# Patient Record
Sex: Female | Born: 1955 | Race: White | Hispanic: No | Marital: Single | Smoking: Never smoker
Health system: Southern US, Community
[De-identification: ages and names within clinical notes are randomized; demographics above are authoritative.]

## PROBLEM LIST (undated history)

## (undated) DIAGNOSIS — I4892 Unspecified atrial flutter: Secondary | ICD-10-CM

## (undated) DIAGNOSIS — R519 Headache, unspecified: Secondary | ICD-10-CM

## (undated) DIAGNOSIS — G629 Polyneuropathy, unspecified: Secondary | ICD-10-CM

## (undated) DIAGNOSIS — G4733 Obstructive sleep apnea (adult) (pediatric): Secondary | ICD-10-CM

## (undated) DIAGNOSIS — F32A Depression, unspecified: Secondary | ICD-10-CM

## (undated) DIAGNOSIS — I219 Acute myocardial infarction, unspecified: Secondary | ICD-10-CM

## (undated) DIAGNOSIS — I499 Cardiac arrhythmia, unspecified: Secondary | ICD-10-CM

## (undated) DIAGNOSIS — R0600 Dyspnea, unspecified: Secondary | ICD-10-CM

## (undated) DIAGNOSIS — M1990 Unspecified osteoarthritis, unspecified site: Secondary | ICD-10-CM

## (undated) DIAGNOSIS — Z87898 Personal history of other specified conditions: Secondary | ICD-10-CM

## (undated) DIAGNOSIS — K219 Gastro-esophageal reflux disease without esophagitis: Secondary | ICD-10-CM

## (undated) DIAGNOSIS — E119 Type 2 diabetes mellitus without complications: Secondary | ICD-10-CM

## (undated) DIAGNOSIS — G244 Idiopathic orofacial dystonia: Secondary | ICD-10-CM

## (undated) DIAGNOSIS — N900 Mild vulvar dysplasia: Secondary | ICD-10-CM

## (undated) DIAGNOSIS — R251 Tremor, unspecified: Secondary | ICD-10-CM

## (undated) DIAGNOSIS — R609 Edema, unspecified: Secondary | ICD-10-CM

## (undated) DIAGNOSIS — J449 Chronic obstructive pulmonary disease, unspecified: Secondary | ICD-10-CM

## (undated) DIAGNOSIS — G20 Parkinson's disease: Secondary | ICD-10-CM

## (undated) DIAGNOSIS — E669 Obesity, unspecified: Secondary | ICD-10-CM

## (undated) DIAGNOSIS — I2510 Atherosclerotic heart disease of native coronary artery without angina pectoris: Secondary | ICD-10-CM

## (undated) DIAGNOSIS — I10 Essential (primary) hypertension: Secondary | ICD-10-CM

## (undated) DIAGNOSIS — J45909 Unspecified asthma, uncomplicated: Secondary | ICD-10-CM

## (undated) DIAGNOSIS — F329 Major depressive disorder, single episode, unspecified: Secondary | ICD-10-CM

## (undated) DIAGNOSIS — I82409 Acute embolism and thrombosis of unspecified deep veins of unspecified lower extremity: Secondary | ICD-10-CM

## (undated) DIAGNOSIS — Z87442 Personal history of urinary calculi: Secondary | ICD-10-CM

## (undated) DIAGNOSIS — G20A1 Parkinson's disease without dyskinesia, without mention of fluctuations: Secondary | ICD-10-CM

## (undated) DIAGNOSIS — Z7409 Other reduced mobility: Secondary | ICD-10-CM

## (undated) DIAGNOSIS — R0902 Hypoxemia: Secondary | ICD-10-CM

## (undated) DIAGNOSIS — R51 Headache: Secondary | ICD-10-CM

## (undated) HISTORY — PX: TUBAL LIGATION: SHX77

## (undated) HISTORY — PX: EYE SURGERY: SHX253

## (undated) HISTORY — PX: LAPAROSCOPIC TUBAL LIGATION: SUR803

## (undated) HISTORY — PX: JOINT REPLACEMENT: SHX530

## (undated) HISTORY — DX: Mild vulvar dysplasia: N90.0

## (undated) HISTORY — PX: KIDNEY STONE SURGERY: SHX686

## (undated) HISTORY — PX: CHOLECYSTECTOMY: SHX55

## (undated) HISTORY — PX: TONSILLECTOMY AND ADENOIDECTOMY: SUR1326

---

## 2005-11-11 ENCOUNTER — Ambulatory Visit: Payer: Self-pay | Admitting: Family Medicine

## 2005-11-13 ENCOUNTER — Ambulatory Visit: Payer: Self-pay | Admitting: Family Medicine

## 2005-11-29 ENCOUNTER — Ambulatory Visit: Payer: Self-pay | Admitting: Family Medicine

## 2005-12-30 ENCOUNTER — Ambulatory Visit: Payer: Self-pay | Admitting: Family Medicine

## 2006-01-30 ENCOUNTER — Ambulatory Visit: Payer: Self-pay | Admitting: Family Medicine

## 2007-09-01 ENCOUNTER — Emergency Department: Payer: Self-pay | Admitting: Emergency Medicine

## 2007-10-27 ENCOUNTER — Ambulatory Visit: Payer: Self-pay | Admitting: Gastroenterology

## 2007-10-29 ENCOUNTER — Ambulatory Visit: Payer: Self-pay | Admitting: Family Medicine

## 2008-01-15 ENCOUNTER — Encounter: Payer: Self-pay | Admitting: Family Medicine

## 2008-01-31 ENCOUNTER — Encounter: Payer: Self-pay | Admitting: Family Medicine

## 2008-02-28 ENCOUNTER — Encounter: Payer: Self-pay | Admitting: Family Medicine

## 2008-03-14 ENCOUNTER — Ambulatory Visit: Payer: Self-pay | Admitting: Family Medicine

## 2008-03-30 ENCOUNTER — Ambulatory Visit: Payer: Self-pay | Admitting: Family Medicine

## 2008-04-29 ENCOUNTER — Ambulatory Visit: Payer: Self-pay | Admitting: Family Medicine

## 2008-09-12 ENCOUNTER — Ambulatory Visit: Payer: Self-pay | Admitting: Unknown Physician Specialty

## 2008-09-23 ENCOUNTER — Inpatient Hospital Stay: Payer: Self-pay | Admitting: Unknown Physician Specialty

## 2008-12-19 ENCOUNTER — Ambulatory Visit: Payer: Self-pay | Admitting: Family Medicine

## 2009-04-25 ENCOUNTER — Ambulatory Visit: Payer: Self-pay | Admitting: Family Medicine

## 2010-05-10 ENCOUNTER — Ambulatory Visit: Payer: Self-pay | Admitting: Neurology

## 2010-06-28 ENCOUNTER — Ambulatory Visit: Payer: Self-pay | Admitting: Family Medicine

## 2011-11-25 ENCOUNTER — Ambulatory Visit: Payer: Self-pay | Admitting: Family Medicine

## 2012-01-09 ENCOUNTER — Ambulatory Visit: Payer: Self-pay | Admitting: Primary Care

## 2012-07-20 ENCOUNTER — Ambulatory Visit: Payer: Self-pay | Admitting: Family Medicine

## 2012-07-23 ENCOUNTER — Emergency Department: Payer: Self-pay | Admitting: Emergency Medicine

## 2012-07-23 LAB — COMPREHENSIVE METABOLIC PANEL
Alkaline Phosphatase: 237 U/L — ABNORMAL HIGH (ref 50–136)
Bilirubin,Total: 3.4 mg/dL — ABNORMAL HIGH (ref 0.2–1.0)
Calcium, Total: 8.4 mg/dL — ABNORMAL LOW (ref 8.5–10.1)
Chloride: 100 mmol/L (ref 98–107)
Co2: 26 mmol/L (ref 21–32)
EGFR (African American): 51 — ABNORMAL LOW
EGFR (Non-African Amer.): 44 — ABNORMAL LOW
Glucose: 480 mg/dL — ABNORMAL HIGH (ref 65–99)
Osmolality: 292 (ref 275–301)
SGPT (ALT): 152 U/L — ABNORMAL HIGH
Sodium: 135 mmol/L — ABNORMAL LOW (ref 136–145)

## 2012-07-23 LAB — URINALYSIS, COMPLETE
Glucose,UR: >=500 mg/dL (ref 0–75)
Nitrite: NEGATIVE
Protein: NEGATIVE
RBC,UR: NONE SEEN /HPF (ref 0–5)
Specific Gravity: 1.027 (ref 1.003–1.030)
Squamous Epithelial: 1
WBC UR: NONE SEEN /HPF (ref 0–5)

## 2012-07-23 LAB — CBC
HCT: 33.4 % — ABNORMAL LOW (ref 35.0–47.0)
HGB: 10.6 g/dL — ABNORMAL LOW (ref 12.0–16.0)
MCH: 27.7 pg (ref 26.0–34.0)
MCHC: 31.7 g/dL — ABNORMAL LOW (ref 32.0–36.0)
MCV: 87 fL (ref 80–100)
Platelet: 260 10*3/uL (ref 150–440)
RDW: 18.4 % — ABNORMAL HIGH (ref 11.5–14.5)

## 2012-07-23 LAB — LIPASE, BLOOD: Lipase: 1847 U/L — ABNORMAL HIGH (ref 73–393)

## 2012-12-01 IMAGING — CR DG CHEST 2V
1 series · 2 of 2 positions shown · non-contrast
Comparison: none

REASON FOR EXAM: Pneumonia
COMMENTS:

[Series 1: w chest ap · 0.14mm/px · 2 of 2 slices shown]
[im 1/2]
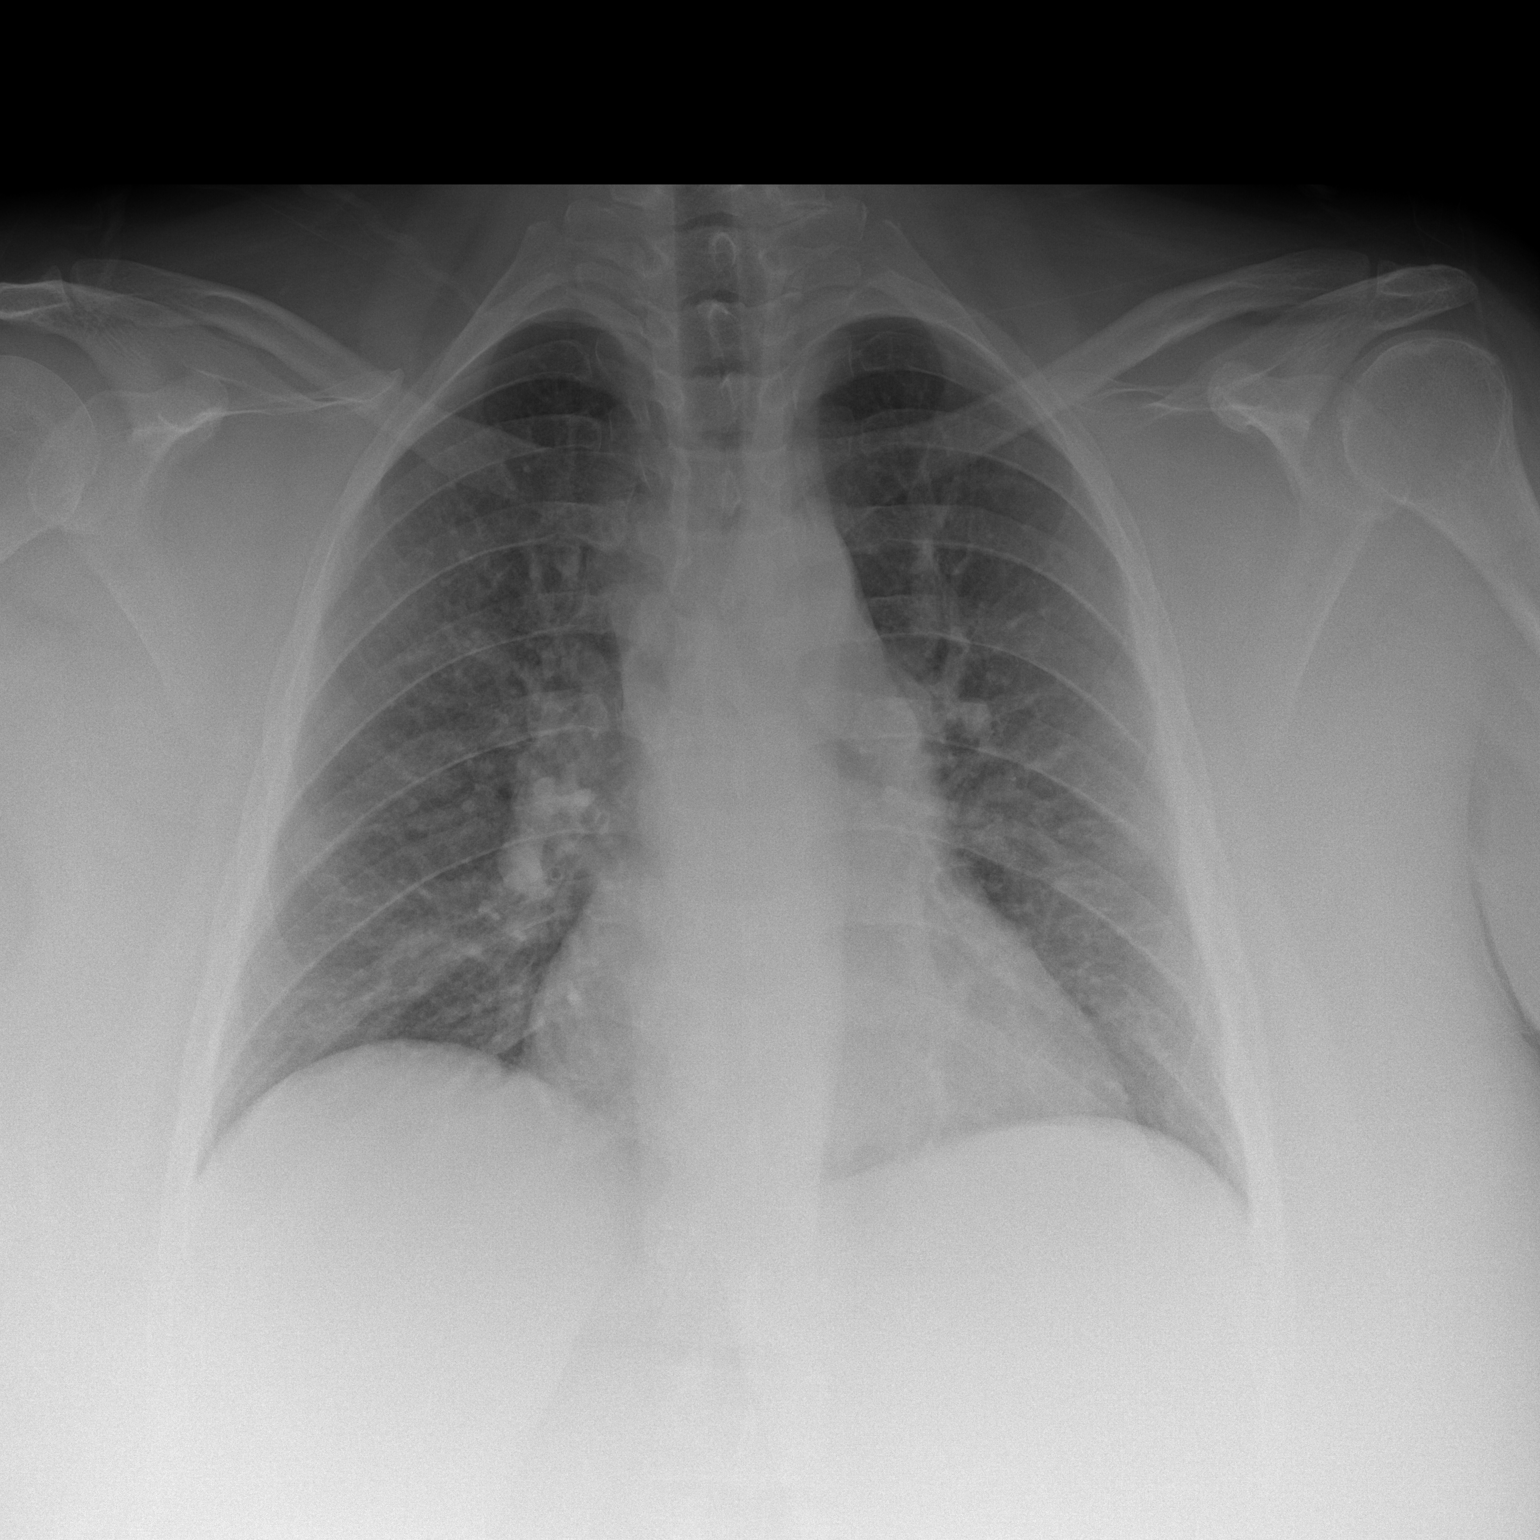
[im 2/2]
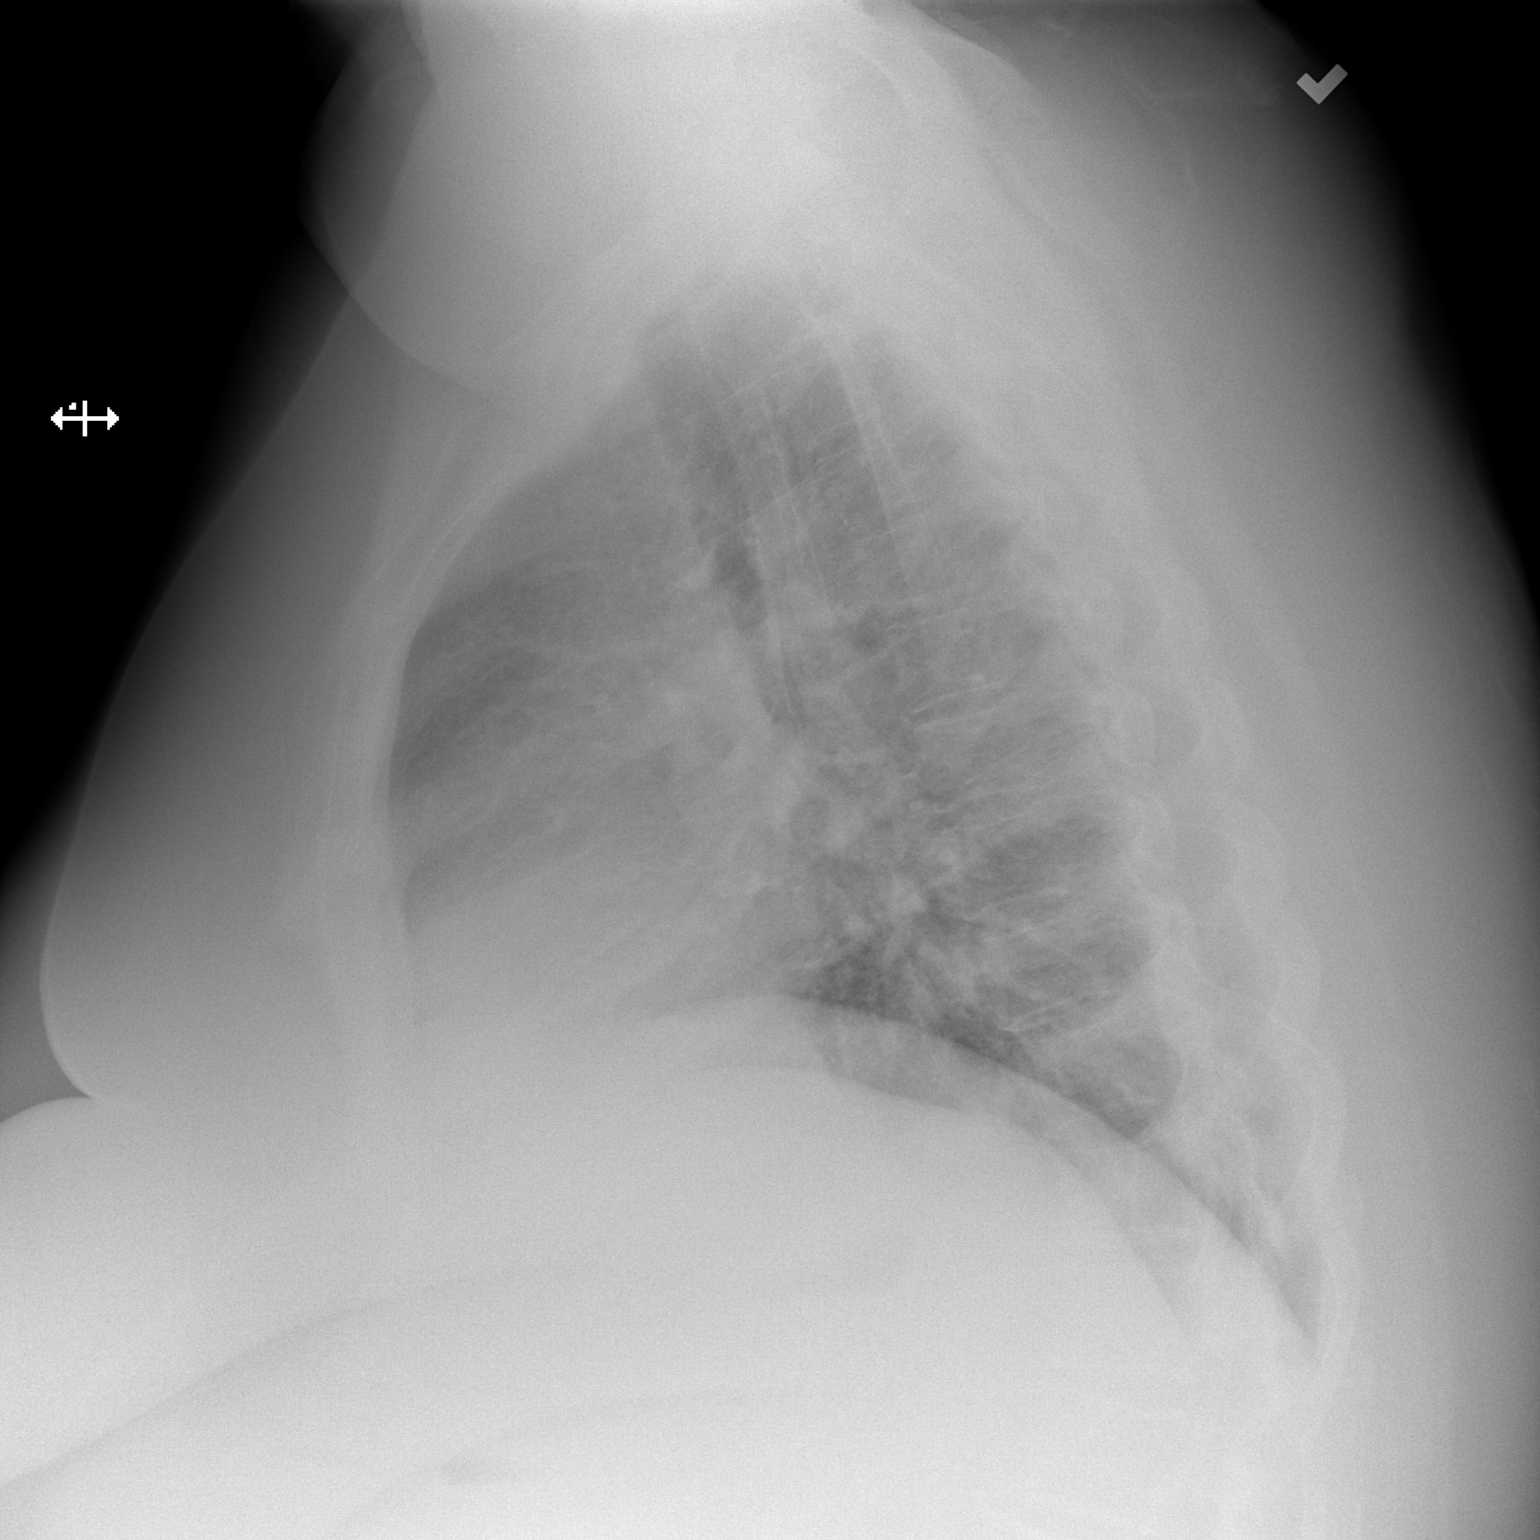

[2 of 2 positions shown; findings below may reference images not displayed]

PROCEDURE:     DXR - DXR CHEST PA (OR AP) AND LATERAL  - July 20, 2012  [DATE]

RESULT:     Comparison is made to a prior study dated 01/09/2012.

There has been interval development of a rounded area of increased density
within the left lower lobe. Differential considerations are an infiltrate
versus atelectasis. A nodule cannot be excluded. Clinical correlation
recommended and surveillance evaluation status post appropriate therapeutic
management. The cardiac silhouette and visualized bony skeleton is
unremarkable.
IMPRESSION: Right lower lobe infiltrate and/or atelectasis as described above.
Surveillance evaluation recommended. More ominous etiologies cannot be
completely excluded.

Thank you for the opportunity to contribute to the care of your patient.

ADDENDUM:  07/22/2012: Correction to report dated 07/20/2012

Comparison is made to a prior study dated 01/09/2012.

There has been interval development of a rounded area of increased density
within the RIGHT lower lobe. Differential considerations are an infiltrate
versus atelectasis.  A nodule cannot be excluded. Clinical correlation
recommended and surveillance evaluation status post appropriate therapeutic
management. The cardiac silhouette and visualized bony skeleton is
unremarkable.
CONCLUSION: Right lower lobe infiltrate and/or atelectasis as described above.
Surveillance evaluation recommended.  More ominous etiologies cannot be
completely excluded.

## 2013-02-24 ENCOUNTER — Ambulatory Visit: Payer: Self-pay | Admitting: Family Medicine

## 2014-03-10 ENCOUNTER — Ambulatory Visit: Payer: Self-pay | Admitting: Family Medicine

## 2014-03-16 ENCOUNTER — Ambulatory Visit: Payer: Self-pay | Admitting: Family Medicine

## 2014-12-29 ENCOUNTER — Emergency Department: Payer: Self-pay | Admitting: Internal Medicine

## 2014-12-29 LAB — URINALYSIS, COMPLETE
BLOOD: NEGATIVE
Bilirubin,UR: NEGATIVE
Glucose,UR: >=500 mg/dL (ref 0–75)
Hyaline Cast: 2
Ketone: NEGATIVE
Leukocyte Esterase: NEGATIVE
Nitrite: NEGATIVE
PH: 5 (ref 4.5–8.0)
RBC,UR: <1 /HPF (ref 0–5)
SPECIFIC GRAVITY: 1.017 (ref 1.003–1.030)
Squamous Epithelial: 1
WBC UR: 1 /HPF (ref 0–5)

## 2014-12-29 LAB — COMPREHENSIVE METABOLIC PANEL
AST: 74 U/L — AB (ref 15–37)
Albumin: 2.8 g/dL — ABNORMAL LOW (ref 3.4–5.0)
Alkaline Phosphatase: 95 U/L
Anion Gap: 6 — ABNORMAL LOW (ref 7–16)
BILIRUBIN TOTAL: 0.7 mg/dL (ref 0.2–1.0)
BUN: 13 mg/dL (ref 7–18)
CO2: 30 mmol/L (ref 21–32)
CREATININE: 0.96 mg/dL (ref 0.60–1.30)
Calcium, Total: 8.8 mg/dL (ref 8.5–10.1)
Chloride: 101 mmol/L (ref 98–107)
EGFR (African American): >60
Glucose: 289 mg/dL — ABNORMAL HIGH (ref 65–99)
Osmolality: 285 (ref 275–301)
Potassium: 4.6 mmol/L (ref 3.5–5.1)
SGPT (ALT): 37 U/L
Sodium: 137 mmol/L (ref 136–145)
TOTAL PROTEIN: 7.6 g/dL (ref 6.4–8.2)

## 2014-12-29 LAB — CBC
HCT: 38.9 % (ref 35.0–47.0)
HGB: 12.1 g/dL (ref 12.0–16.0)
MCH: 26.6 pg (ref 26.0–34.0)
MCHC: 31 g/dL — ABNORMAL LOW (ref 32.0–36.0)
MCV: 86 fL (ref 80–100)
Platelet: 188 10*3/uL (ref 150–440)
RBC: 4.54 10*6/uL (ref 3.80–5.20)
RDW: 15.8 % — ABNORMAL HIGH (ref 11.5–14.5)
WBC: 5.5 10*3/uL (ref 3.6–11.0)

## 2015-02-04 ENCOUNTER — Emergency Department: Payer: Self-pay | Admitting: Emergency Medicine

## 2015-02-04 ENCOUNTER — Inpatient Hospital Stay: Admission: AD | Admit: 2015-02-04 | Payer: Self-pay | Source: Other Acute Inpatient Hospital | Admitting: Cardiology

## 2015-02-04 LAB — CBC
HCT: 44.8 % (ref 35.0–47.0)
HGB: 14.3 g/dL (ref 12.0–16.0)
MCH: 26.9 pg (ref 26.0–34.0)
MCHC: 31.9 g/dL — ABNORMAL LOW (ref 32.0–36.0)
MCV: 84 fL (ref 80–100)
PLATELETS: 221 10*3/uL (ref 150–440)
RBC: 5.32 10*6/uL — AB (ref 3.80–5.20)
RDW: 15.8 % — ABNORMAL HIGH (ref 11.5–14.5)
WBC: 8.5 10*3/uL (ref 3.6–11.0)

## 2015-02-04 LAB — TROPONIN I

## 2015-02-04 LAB — URINALYSIS, COMPLETE
Bilirubin,UR: NEGATIVE
Broad Cast: 20
Glucose,UR: >=500 mg/dL (ref 0–75)
Hyaline Cast: 61
KETONE: NEGATIVE
Nitrite: NEGATIVE
PH: 5 (ref 4.5–8.0)
Protein: 100
RBC,UR: 26 /HPF (ref 0–5)
SPECIFIC GRAVITY: 1.02 (ref 1.003–1.030)
Squamous Epithelial: NONE SEEN

## 2015-02-04 LAB — PROTIME-INR
INR: 1.1
Prothrombin Time: 13.9 secs

## 2015-02-04 LAB — BASIC METABOLIC PANEL
Anion Gap: 9 (ref 7–16)
BUN: 17 mg/dL (ref 7–18)
CHLORIDE: 94 mmol/L — AB (ref 98–107)
CREATININE: 1.69 mg/dL — AB (ref 0.60–1.30)
Calcium, Total: 9.4 mg/dL (ref 8.5–10.1)
Co2: 27 mmol/L (ref 21–32)
EGFR (African American): 40 — ABNORMAL LOW
EGFR (Non-African Amer.): 33 — ABNORMAL LOW
Glucose: 341 mg/dL — ABNORMAL HIGH (ref 65–99)
Osmolality: 276 (ref 275–301)
POTASSIUM: 4.4 mmol/L (ref 3.5–5.1)
Sodium: 130 mmol/L — ABNORMAL LOW (ref 136–145)

## 2015-02-04 LAB — PRO B NATRIURETIC PEPTIDE: B-TYPE NATIURETIC PEPTID: 157 pg/mL — AB (ref 0–125)

## 2015-04-26 ENCOUNTER — Ambulatory Visit
Admit: 2015-04-26 | Disposition: A | Discharge status: Home / Self Care | Payer: Self-pay | Attending: Family Medicine | Admitting: Family Medicine

## 2015-07-08 ENCOUNTER — Encounter: Payer: Self-pay | Admitting: Internal Medicine

## 2015-07-08 ENCOUNTER — Emergency Department: Payer: Medicare Other

## 2015-07-08 ENCOUNTER — Observation Stay
Admission: EM | Admit: 2015-07-08 | Discharge: 2015-07-09 | Disposition: A | Discharge status: Home / Self Care | Payer: Medicare Other | Attending: Internal Medicine | Admitting: Internal Medicine

## 2015-07-08 DIAGNOSIS — Z882 Allergy status to sulfonamides status: Secondary | ICD-10-CM | POA: Insufficient documentation

## 2015-07-08 DIAGNOSIS — Z888 Allergy status to other drugs, medicaments and biological substances status: Secondary | ICD-10-CM | POA: Insufficient documentation

## 2015-07-08 DIAGNOSIS — R609 Edema, unspecified: Secondary | ICD-10-CM

## 2015-07-08 DIAGNOSIS — I25119 Atherosclerotic heart disease of native coronary artery with unspecified angina pectoris: Principal | ICD-10-CM | POA: Insufficient documentation

## 2015-07-08 DIAGNOSIS — Z794 Long term (current) use of insulin: Secondary | ICD-10-CM | POA: Insufficient documentation

## 2015-07-08 DIAGNOSIS — I252 Old myocardial infarction: Secondary | ICD-10-CM | POA: Insufficient documentation

## 2015-07-08 DIAGNOSIS — Z7982 Long term (current) use of aspirin: Secondary | ICD-10-CM | POA: Diagnosis not present

## 2015-07-08 DIAGNOSIS — Z825 Family history of asthma and other chronic lower respiratory diseases: Secondary | ICD-10-CM | POA: Insufficient documentation

## 2015-07-08 DIAGNOSIS — Z8249 Family history of ischemic heart disease and other diseases of the circulatory system: Secondary | ICD-10-CM | POA: Diagnosis not present

## 2015-07-08 DIAGNOSIS — I208 Other forms of angina pectoris: Secondary | ICD-10-CM | POA: Diagnosis present

## 2015-07-08 DIAGNOSIS — E785 Hyperlipidemia, unspecified: Secondary | ICD-10-CM | POA: Diagnosis not present

## 2015-07-08 DIAGNOSIS — Z9889 Other specified postprocedural states: Secondary | ICD-10-CM | POA: Insufficient documentation

## 2015-07-08 DIAGNOSIS — Z833 Family history of diabetes mellitus: Secondary | ICD-10-CM | POA: Insufficient documentation

## 2015-07-08 DIAGNOSIS — I10 Essential (primary) hypertension: Secondary | ICD-10-CM | POA: Insufficient documentation

## 2015-07-08 DIAGNOSIS — N289 Disorder of kidney and ureter, unspecified: Secondary | ICD-10-CM | POA: Diagnosis not present

## 2015-07-08 DIAGNOSIS — I2510 Atherosclerotic heart disease of native coronary artery without angina pectoris: Secondary | ICD-10-CM | POA: Diagnosis present

## 2015-07-08 DIAGNOSIS — R079 Chest pain, unspecified: Secondary | ICD-10-CM

## 2015-07-08 DIAGNOSIS — J449 Chronic obstructive pulmonary disease, unspecified: Secondary | ICD-10-CM | POA: Diagnosis not present

## 2015-07-08 DIAGNOSIS — E1165 Type 2 diabetes mellitus with hyperglycemia: Secondary | ICD-10-CM | POA: Diagnosis not present

## 2015-07-08 DIAGNOSIS — F32A Depression, unspecified: Secondary | ICD-10-CM | POA: Diagnosis present

## 2015-07-08 DIAGNOSIS — I4892 Unspecified atrial flutter: Secondary | ICD-10-CM | POA: Diagnosis not present

## 2015-07-08 DIAGNOSIS — R251 Tremor, unspecified: Secondary | ICD-10-CM | POA: Diagnosis not present

## 2015-07-08 DIAGNOSIS — Z9049 Acquired absence of other specified parts of digestive tract: Secondary | ICD-10-CM | POA: Diagnosis not present

## 2015-07-08 DIAGNOSIS — Z9660 Presence of unspecified orthopedic joint implant: Secondary | ICD-10-CM | POA: Insufficient documentation

## 2015-07-08 DIAGNOSIS — R0602 Shortness of breath: Secondary | ICD-10-CM | POA: Insufficient documentation

## 2015-07-08 DIAGNOSIS — Z883 Allergy status to other anti-infective agents status: Secondary | ICD-10-CM | POA: Diagnosis not present

## 2015-07-08 DIAGNOSIS — F329 Major depressive disorder, single episode, unspecified: Secondary | ICD-10-CM | POA: Diagnosis not present

## 2015-07-08 DIAGNOSIS — K219 Gastro-esophageal reflux disease without esophagitis: Secondary | ICD-10-CM | POA: Insufficient documentation

## 2015-07-08 DIAGNOSIS — IMO0002 Reserved for concepts with insufficient information to code with codable children: Secondary | ICD-10-CM | POA: Diagnosis present

## 2015-07-08 DIAGNOSIS — J45909 Unspecified asthma, uncomplicated: Secondary | ICD-10-CM | POA: Diagnosis not present

## 2015-07-08 HISTORY — DX: Major depressive disorder, single episode, unspecified: F32.9

## 2015-07-08 HISTORY — DX: Depression, unspecified: F32.A

## 2015-07-08 HISTORY — DX: Type 2 diabetes mellitus without complications: E11.9

## 2015-07-08 HISTORY — DX: Unspecified atrial flutter: I48.92

## 2015-07-08 HISTORY — DX: Unspecified asthma, uncomplicated: J45.909

## 2015-07-08 HISTORY — DX: Tremor, unspecified: R25.1

## 2015-07-08 HISTORY — DX: Atherosclerotic heart disease of native coronary artery without angina pectoris: I25.10

## 2015-07-08 HISTORY — DX: Gastro-esophageal reflux disease without esophagitis: K21.9

## 2015-07-08 HISTORY — DX: Essential (primary) hypertension: I10

## 2015-07-08 LAB — GLUCOSE, CAPILLARY: Glucose-Capillary: 348 mg/dL — ABNORMAL HIGH (ref 65–99)

## 2015-07-08 LAB — BASIC METABOLIC PANEL
ANION GAP: 11 (ref 5–15)
BUN: 25 mg/dL — ABNORMAL HIGH (ref 6–20)
CALCIUM: 8.2 mg/dL — AB (ref 8.9–10.3)
CO2: 27 mmol/L (ref 22–32)
Chloride: 98 mmol/L — ABNORMAL LOW (ref 101–111)
Creatinine, Ser: 1.61 mg/dL — ABNORMAL HIGH (ref 0.44–1.00)
GFR calc Af Amer: 40 mL/min — ABNORMAL LOW (ref >60)
GFR calc non Af Amer: 34 mL/min — ABNORMAL LOW (ref >60)
Glucose, Bld: 339 mg/dL — ABNORMAL HIGH (ref 65–99)
Potassium: 4.2 mmol/L (ref 3.5–5.1)
SODIUM: 136 mmol/L (ref 135–145)

## 2015-07-08 LAB — HEPATIC FUNCTION PANEL
ALBUMIN: 3.4 g/dL — AB (ref 3.5–5.0)
ALK PHOS: 82 U/L (ref 38–126)
ALT: 23 U/L (ref 14–54)
AST: 41 U/L (ref 15–41)
BILIRUBIN DIRECT: 0.2 mg/dL (ref 0.1–0.5)
BILIRUBIN INDIRECT: 0.7 mg/dL (ref 0.3–0.9)
BILIRUBIN TOTAL: 0.9 mg/dL (ref 0.3–1.2)
Total Protein: 7.9 g/dL (ref 6.5–8.1)

## 2015-07-08 LAB — FIBRIN DERIVATIVES D-DIMER (ARMC ONLY): Fibrin derivatives D-dimer (ARMC): 1006 — ABNORMAL HIGH (ref 0–499)

## 2015-07-08 LAB — CBC
HEMATOCRIT: 39 % (ref 35.0–47.0)
Hemoglobin: 12.6 g/dL (ref 12.0–16.0)
MCH: 27.3 pg (ref 26.0–34.0)
MCHC: 32.2 g/dL (ref 32.0–36.0)
MCV: 84.9 fL (ref 80.0–100.0)
Platelets: 185 10*3/uL (ref 150–440)
RBC: 4.59 MIL/uL (ref 3.80–5.20)
RDW: 16.7 % — ABNORMAL HIGH (ref 11.5–14.5)
WBC: 6.5 10*3/uL (ref 3.6–11.0)

## 2015-07-08 LAB — TROPONIN I

## 2015-07-08 MED ORDER — ALBUTEROL SULFATE (2.5 MG/3ML) 0.083% IN NEBU
3.0000 mL | INHALATION_SOLUTION | RESPIRATORY_TRACT | Status: DC | PRN
Start: 2015-07-08 — End: 2015-07-09

## 2015-07-08 MED ORDER — GABAPENTIN 300 MG PO CAPS
300.0000 mg | ORAL_CAPSULE | Freq: Two times a day (BID) | ORAL | Status: DC
  Administered 2015-07-08 – 2015-07-09 (×2): 300 mg via ORAL
  Filled 2015-07-08 (×2): qty 1

## 2015-07-08 MED ORDER — ACETAMINOPHEN 650 MG RE SUPP: 650.0000 mg | Freq: Four times a day (QID) | RECTAL | Status: DC | PRN

## 2015-07-08 MED ORDER — ACETAMINOPHEN 325 MG PO TABS: 650.0000 mg | ORAL_TABLET | Freq: Four times a day (QID) | ORAL | Status: DC | PRN

## 2015-07-08 MED ORDER — INSULIN ASPART 100 UNIT/ML ~~LOC~~ SOLN
0.0000 [IU] | Freq: Every day | SUBCUTANEOUS | Status: DC
  Administered 2015-07-08: 4 [IU] via SUBCUTANEOUS
  Filled 2015-07-08: qty 4

## 2015-07-08 MED ORDER — HEPARIN SODIUM (PORCINE) 5000 UNIT/ML IJ SOLN
5000.0000 [IU] | Freq: Three times a day (TID) | INTRAMUSCULAR | Status: DC
Start: 2015-07-08 — End: 2015-07-09
  Administered 2015-07-08 – 2015-07-09 (×2): 5000 [IU] via SUBCUTANEOUS
  Filled 2015-07-08 (×2): qty 1

## 2015-07-08 MED ORDER — ASPIRIN EC 81 MG PO TBEC
81.0000 mg | DELAYED_RELEASE_TABLET | Freq: Every day | ORAL | Status: DC
  Administered 2015-07-09: 81 mg via ORAL
  Filled 2015-07-08: qty 1

## 2015-07-08 MED ORDER — INSULIN GLARGINE 100 UNIT/ML ~~LOC~~ SOLN
30.0000 [IU] | Freq: Two times a day (BID) | SUBCUTANEOUS | Status: DC
  Administered 2015-07-08 – 2015-07-09 (×2): 30 [IU] via SUBCUTANEOUS
  Filled 2015-07-08 (×4): qty 0.3

## 2015-07-08 MED ORDER — INSULIN ASPART 100 UNIT/ML ~~LOC~~ SOLN
0.0000 [IU] | Freq: Three times a day (TID) | SUBCUTANEOUS | Status: DC
  Administered 2015-07-09: 11 [IU] via SUBCUTANEOUS
  Administered 2015-07-09: 8 [IU] via SUBCUTANEOUS
  Filled 2015-07-08: qty 11
  Filled 2015-07-08: qty 8

## 2015-07-08 MED ORDER — PAROXETINE HCL 10 MG PO TABS
40.0000 mg | ORAL_TABLET | ORAL | Status: DC
  Administered 2015-07-09: 40 mg via ORAL
  Filled 2015-07-08: qty 4

## 2015-07-08 MED ORDER — ROSUVASTATIN CALCIUM 10 MG PO TABS
10.0000 mg | ORAL_TABLET | Freq: Every day | ORAL | Status: DC
  Administered 2015-07-09: 10 mg via ORAL
  Filled 2015-07-08: qty 1

## 2015-07-08 MED ORDER — PANTOPRAZOLE SODIUM 40 MG PO TBEC
40.0000 mg | DELAYED_RELEASE_TABLET | Freq: Every day | ORAL | Status: DC
  Administered 2015-07-09: 40 mg via ORAL
  Filled 2015-07-08: qty 1

## 2015-07-08 MED ORDER — LISINOPRIL 10 MG PO TABS
10.0000 mg | ORAL_TABLET | Freq: Every day | ORAL | Status: DC
  Administered 2015-07-09: 10 mg via ORAL
  Filled 2015-07-08: qty 1

## 2015-07-08 MED ORDER — BUDESONIDE-FORMOTEROL FUMARATE 160-4.5 MCG/ACT IN AERO
2.0000 | INHALATION_SPRAY | Freq: Two times a day (BID) | RESPIRATORY_TRACT | Status: DC
  Administered 2015-07-08 – 2015-07-09 (×2): 2 via RESPIRATORY_TRACT
  Filled 2015-07-08: qty 6

## 2015-07-08 MED ORDER — METOPROLOL TARTRATE 25 MG PO TABS
12.5000 mg | ORAL_TABLET | Freq: Two times a day (BID) | ORAL | Status: DC
  Administered 2015-07-08 – 2015-07-09 (×2): 12.5 mg via ORAL
  Filled 2015-07-08 (×2): qty 1

## 2015-07-08 MED ORDER — HYDROCODONE-ACETAMINOPHEN 10-325 MG PO TABS: 1.0000 | ORAL_TABLET | Freq: Three times a day (TID) | ORAL | Status: DC | PRN

## 2015-07-08 NOTE — H&P (Signed)
[facility] [place] at [facility]   PATIENT NAME: Audrey Hughes    MR#:  [id number]  DATE OF BIRTH:  17-Feb-1956  DATE OF ADMISSION:  07/08/2015  PRIMARY CARE PHYSICIAN: Hillery Aldo, MD   REQUESTING/REFERRING PHYSICIAN: Malinda  CHIEF COMPLAINT:   Chief Complaint  Patient presents with  . Chest Pain    HISTORY OF PRESENT ILLNESS:  Audrey Hughes  is a 59 y.o. female who presents with an episode of angina at rest. Patient had an MI back in February 2016. She states that today she was resting when she had acute onset of diffuse chest discomfort. She is unable to characterize her chest pain any more than that. She states that it did move up into her throat. It was associated with some shortness of breath, but without any nausea or vomiting, diaphoresis, dizziness, or vision changes. She called EMS and was told to take 4 baby aspirin, which she did. Shortly after that the pain started to subside and eventually resolved completely. In the ED her workup is largely negative. Troponin negative 1, EKG unchanged from prior, other labs largely within normal limits. She did have a d-dimer checked in the ED which was elevated, but her well's score for PE is 0, and so was decided not to scan her for PE. Hospitalists were called for admission for ACS rule out. Of note, patient does have a history of paroxysmal atrial flutter, and did state that she had some palpitations around the time of her chest pain.  PAST MEDICAL HISTORY:   Past Medical History  Diagnosis Date  . Diabetes mellitus   . Asthma   . CAD (coronary artery disease)     s/p MI  . Depression   . HTN (hypertension)   . Tremor   . Paroxysmal atrial flutter   . GERD (gastroesophageal reflux disease)     PAST SURGICAL HISTORY:   Past Surgical History  Procedure Laterality Date  . Cholecystectomy    . Cesarean section    . Abdominal hysterectomy    . Tonsillectomy and adenoidectomy    . Laparoscopic tubal  ligation    . Joint replacement      SOCIAL HISTORY:   History  Substance Use Topics  . Smoking status: Never Smoker   . Smokeless tobacco: Not on file  . Alcohol Use: 0.0 oz/week    0 Standard drinks or equivalent per week     Comment: 1 can of beer    FAMILY HISTORY:   Family History  Problem Relation Age of Onset  . Diabetes Mother   . Heart disease Mother   . COPD Father   . Hypertension Father     DRUG ALLERGIES:   Allergies  Allergen Reactions  . Diltiazem Hives  . Flagyl [Metronidazole] Hives  . Sulfa Antibiotics Hives  . Toradol [Ketorolac Tromethamine] Hives    MEDICATIONS AT HOME:   Prior to Admission medications   Medication Sig Start Date End Date Taking? Authorizing Provider  albuterol (PROVENTIL HFA;VENTOLIN HFA) 108 (90 BASE) MCG/ACT inhaler Inhale 2 puffs into the lungs every 4 (four) hours as needed for wheezing or shortness of breath.   Yes Historical Provider, MD  aspirin 81 MG tablet Take 81 mg by mouth daily.   Yes Historical Provider, MD  budesonide-formoterol (SYMBICORT) 160-4.5 MCG/ACT inhaler Inhale 2 puffs into the lungs 2 (two) times daily.   Yes Historical Provider, MD  gabapentin (NEURONTIN) 300 MG capsule Take 300 mg by mouth 2 (  two) times daily.   Yes Historical Provider, MD  HYDROcodone-acetaminophen (NORCO) 10-325 MG per tablet Take 1 tablet by mouth 3 (three) times daily as needed for severe pain.   Yes Historical Provider, MD  indapamide (LOZOL) 2.5 MG tablet Take 2.5 mg by mouth 2 (two) times daily.   Yes Historical Provider, MD  insulin glargine (LANTUS) 100 UNIT/ML injection Inject 48 Units into the skin 2 (two) times daily.   Yes Historical Provider, MD  insulin lispro (HUMALOG) 100 UNIT/ML injection Inject 35 Units into the skin daily with breakfast.    Yes Historical Provider, MD  insulin lispro (HUMALOG) 100 UNIT/ML injection Inject 45 Units into the skin daily with supper.   Yes Historical Provider, MD  metFORMIN (GLUCOPHAGE)  500 MG tablet Take by mouth 2 (two) times daily with a meal.   Yes Historical Provider, MD  metoprolol tartrate (LOPRESSOR) 25 MG tablet Take 12.5 mg by mouth 2 (two) times daily.   Yes Historical Provider, MD  pantoprazole (PROTONIX) 40 MG tablet Take 40 mg by mouth daily.   Yes Historical Provider, MD  PARoxetine (PAXIL) 40 MG tablet Take 40 mg by mouth every morning.   Yes Historical Provider, MD  quinapril (ACCUPRIL) 10 MG tablet Take 10 mg by mouth 2 (two) times daily.   Yes Historical Provider, MD  rosuvastatin (CRESTOR) 10 MG tablet Take 10 mg by mouth daily.   Yes Historical Provider, MD    REVIEW OF SYSTEMS:  Review of Systems  Constitutional: Negative for fever, chills, weight loss and malaise/fatigue.  HENT: Negative for ear pain, hearing loss and tinnitus.   Eyes: Negative for blurred vision, double vision, pain and redness.  Respiratory: Positive for shortness of breath. Negative for cough and hemoptysis.   Cardiovascular: Positive for chest pain, palpitations and leg swelling (chronic). Negative for orthopnea.  Gastrointestinal: Negative for nausea, vomiting, abdominal pain, diarrhea and constipation.  Genitourinary: Negative for dysuria, frequency and hematuria.  Musculoskeletal: Negative for back pain, joint pain and neck pain.  Skin:       No acne, rash, or lesions  Neurological: Negative for dizziness, tremors, focal weakness and weakness.  Endo/Heme/Allergies: Negative for polydipsia. Does not bruise/bleed easily.  Psychiatric/Behavioral: Negative for depression. The patient is not nervous/anxious and does not have insomnia.      VITAL SIGNS:   Filed Vitals:   07/08/15 1923 07/08/15 2053  BP: 120/69 111/73  Pulse: 104 100  Resp: 20 18  Height: 6' (1.829 m)   Weight: 181.439 kg (400 lb)   SpO2: 97% 100%   Wt Readings from Last 3 Encounters:  07/08/15 181.439 kg (400 lb)    PHYSICAL EXAMINATION:  Physical Exam  Constitutional: She is oriented to person,  place, and time. She appears well-developed and well-nourished. No distress.  HENT:  Head: Normocephalic and atraumatic.  Mouth/Throat: Oropharynx is clear and moist.  Eyes: Conjunctivae and EOM are normal. Pupils are equal, round, and reactive to light. No scleral icterus.  Neck: Normal range of motion. Neck supple. No JVD present. No thyromegaly present.  Cardiovascular: Normal rate, regular rhythm and intact distal pulses.  Exam reveals no gallop and no friction rub.   No murmur heard. Respiratory: Effort normal and breath sounds normal. No respiratory distress. She has no wheezes. She has no rales.  GI: Soft. Bowel sounds are normal. She exhibits no distension. There is no tenderness.  Obese  Musculoskeletal: Normal range of motion. She exhibits edema (Bilateral lower extremity).  No arthritis, no gout  Lymphadenopathy:    She has no cervical adenopathy.  Neurological: She is alert and oriented to person, place, and time. No cranial nerve deficit.  No dysarthria, no aphasia  Skin: Skin is warm and dry. No rash noted. No erythema.  Psychiatric: She has a normal mood and affect. Her behavior is normal. Judgment and thought content normal.    LABORATORY PANEL:   CBC  Recent Labs Lab 07/08/15 1938  WBC 6.5  HGB 12.6  HCT 39.0  PLT 185   ------------------------------------------------------------------------------------------------------------------  Chemistries   Recent Labs Lab 07/08/15 1938  NA 136  K 4.2  CL 98*  CO2 27  GLUCOSE 339*  BUN 25*  CREATININE 1.61*  CALCIUM 8.2*  AST 41  ALT 23  ALKPHOS 82  BILITOT 0.9   ------------------------------------------------------------------------------------------------------------------  Cardiac Enzymes  Recent Labs Lab 07/08/15 1938  TROPONINI <0.03   ------------------------------------------------------------------------------------------------------------------  RADIOLOGY:  Dg Chest Portable 1  View  07/08/2015   CLINICAL DATA:  Chest pain, shortness of breath.  EXAM: PORTABLE CHEST - 1 VIEW  COMPARISON:  February 04, 2015.  FINDINGS: Stable cardiomediastinal silhouette. No pneumothorax or pleural effusion is noted. Both lungs are clear. The visualized skeletal structures are unremarkable.  IMPRESSION: No acute cardiopulmonary abnormality seen.   Electronically Signed   By: Lupita Raider, M.D.   On: 07/08/2015 20:02    EKG:   Orders placed or performed during the hospital encounter of 07/08/15  . ED EKG (<6mins upon arrival to the ED)  . ED EKG (<42mins upon arrival to the ED)    IMPRESSION AND PLAN:  Principal Problem:   Angina at rest - resolved with full strength aspirin at home. Initial workup in the ED negative. We will admit to observation to trend cardiac enzymes, and consult her cardiologist. Audrey Hughes admit with telemetry to monitor for atrial flutter. Active Problems:   CAD (coronary artery disease) - continue home medications for this   Paroxysmal atrial flutter - given her symptom of palpitations with the chest pain, possible she was in a flutter at the time, though this rhythm was not captured on EKG) EMS or in the ED. We'll continue her rate controlling medications all here.   Diabetes type 2, uncontrolled - carb modified diet, home insulin at a reduced dose, and sliding scale coverage.   HTN (hypertension) - controlled, continue home medications   Asthma - continue home inhalers   Depression - continue home meds   GERD (gastroesophageal reflux disease) - equivalent Home dose PPI  All the records are reviewed and case discussed with ED provider. Management plans discussed with the patient and/or family.  DVT PROPHYLAXIS: SubQ heparin  ADMISSION STATUS: Observation  CODE STATUS: Full  TOTAL TIME TAKING CARE OF THIS PATIENT: 45 minutes.    Audrey Hughes 07/08/2015, 9:28 PM  [facility]  Office  [phone]  CC: Primary care  physician; Hillery Aldo, MD

## 2015-07-08 NOTE — ED Notes (Signed)
Provided pt with cup of ice and meal bag.

## 2015-07-08 NOTE — ED Notes (Signed)
Admitting MD at bedside, evaluating pt at this time.

## 2015-07-08 NOTE — ED Notes (Signed)
Pt presents to ED via EMS from personal home with c/o of chest pain. EMS states pt has experienced these presenting sx since today beginning at noon today. EMS states pt has been experiencing intermittent, substernal chest, non-radiating pain with a pain rating of a 7/10. EMS states pt self-administered x4 81 mg aspirin prior to EMS arrival. EMS states pt has a hx of an MI in February. EMS reports NSR on the cardiac monitor. EMS states pt is pain free at time of arrival to ER. EMS states pt is currently on 3L of oxygen at night. VS per EMS listed below:   92% 3L O2  104 HR  140/82 BP 316 CBG

## 2015-07-08 NOTE — ED Provider Notes (Signed)
[facility] Emergency Department Provider Note  ____________________________________________  Time seen: Approximately 7:31 PM  I have reviewed the triage vital signs and the nursing notes.   HISTORY  Chief Complaint Chest Pain   HPI Audrey Hughes is a 59 y.o. female patient complains of chest pain starting today it feels heavy and tight radiates up to the throat and jaw on the back of the neck goes along with shortness of breath and some nausea but no sweating. HEENT seems to be better with rest and may be worse with exercise. She reports feels something like when she had her MI back in February. Pain is moderate in nature but not as severe as her MI. The pain is not constant. It lasts for a long time she SA YS. Patient reports she took 4 baby aspirin is when she called the ambulance and the aspirin seems to really make the pain resolved    past medical history significant for COPD asthma diabetes hypertension and angina Parkinson's disease and sleep apnea. There are no active problems to display for this patient.   No past surgical history on file.  No current outpatient prescriptions on file.  Allergies Review of patient's allergies indicates no known allergies.  No family history on file.  Social History Patient reports she has never smoked  Review of Systems Constitutional: No fever/chills Eyes: No visual changes. ENT: No sore throat. Cardiovascular:  See history of present illness Respiratory:  See history of present illness Gastrointestinal: No abdominal pain.  No nausea, no vomiting.  No diarrhea.  No constipation. Genitourinary: Negative for dysuria. Musculoskeletal: Negative for back pain. Skin: Negative for rash. Neurological: Negative for headaches, focal weakness or numbness. she does have Parkinson's disease and a tremor  10-point ROS otherwise negative.  ____________________________________________   PHYSICAL EXAM:  VITAL  SIGNS: ED Triage Vitals  Enc Vitals Group     BP 07/08/15 1923 120/69 mmHg     Pulse Rate 07/08/15 1923 104     Resp 07/08/15 1923 20     Temp --      Temp src --      SpO2 07/08/15 1923 97 %     Weight 07/08/15 1923 400 lb (181.439 kg)     Height 07/08/15 1923 6' (1.829 m)     Head Cir --      Peak Flow --      Pain Score --      Pain Loc --      Pain Edu? --      Excl. in GC? --     Constitutional: Alert and oriented. Well appearing and in no acute distress. Eyes: Conjunctivae are normal. PERRL. EOMI. Head: Atraumatic. Nose: No congestion/rhinnorhea. Mouth/Throat: Mucous membranes are moist.  Oropharynx non-erythematous. patient does have what appears to be a scar at the back of the soft palate where she had her uvula removed there is a red raised erythematous area proximally 1 x 0.5 cm in size Neck: No stridor.  Cardiovascular: Normal rate, regular rhythm. Grossly normal heart sounds.  Good peripheral circulation. Respiratory: Normal respiratory effort.  No retractions. Lungs  occasional scattered wheezes Gastrointestinal: Soft and nontender. No distention. No abdominal bruits. No CVA tenderness. Musculoskeletal: No lower extremity tenderness nor edema.  No joint effusions. Neurologic:  Normal speech and language. No gross focal neurologic deficits are appreciated. Speech is normal. No gait instability. Skin:  Skin is warm, dry and intact. No rash noted. Psychiatric: Mood and affect are normal.  Speech and behavior are normal.  ____________________________________________   LABS (all labs ordered are listed, but only abnormal results are displayed)  Labs Reviewed  CBC - Abnormal; Notable for the following:    RDW 16.7 (*)    All other components within normal limits  BASIC METABOLIC PANEL - Abnormal; Notable for the following:    Chloride 98 (*)    Glucose, Bld 339 (*)    BUN 25 (*)    Creatinine, Ser 1.61 (*)    Calcium 8.2 (*)    GFR calc non Af Amer 34 (*)    GFR  calc Af Amer 40 (*)    All other components within normal limits  FIBRIN DERIVATIVES D-DIMER ([facility] ONLY) - Abnormal; Notable for the following:    Fibrin derivatives D-dimer ([facility]) 1006 (*)    All other components within normal limits  HEPATIC FUNCTION PANEL - Abnormal; Notable for the following:    Albumin 3.4 (*)    All other components within normal limits  TROPONIN I   ____________________________________________  EKG  KG read and interpreted by me shows sinus tach at a rate of 102. Reading rightward axis but just barely no acute ST-T wave changes patient has had EKG changes with rightward axis on previous EKGs ____________________________________________  RADIOLOGY  Chest x-ray is read as no acute disease       ____________________________________________   PROCEDURES    ____________________________________________   INITIAL IMPRESSION / ASSESSMENT AND PLAN / ED COURSE  Pertinent labs & imaging results that were available during my care of the patient were reviewed by me and considered in my medical decision making (see chart for details).  Labs are essentially negative except for the d-dimer I will discuss with the hospitalist the advisability of using a reduced dose contrast for CT versus VQ scan ____________________________________________   FINAL CLINICAL IMPRESSION(S) / ED DIAGNOSES  Final diagnoses:  Chest pain, unspecified chest pain type      Arnaldo NatalPaul F Borghild Thaker, MD 07/08/15 2035

## 2015-07-09 ENCOUNTER — Observation Stay: Payer: Medicare Other

## 2015-07-09 DIAGNOSIS — R079 Chest pain, unspecified: Secondary | ICD-10-CM | POA: Diagnosis present

## 2015-07-09 DIAGNOSIS — I25119 Atherosclerotic heart disease of native coronary artery with unspecified angina pectoris: Secondary | ICD-10-CM | POA: Diagnosis not present

## 2015-07-09 LAB — CBC
HEMATOCRIT: 38.7 % (ref 35.0–47.0)
HEMOGLOBIN: 12.4 g/dL (ref 12.0–16.0)
MCH: 27.3 pg (ref 26.0–34.0)
MCHC: 32 g/dL (ref 32.0–36.0)
MCV: 85.5 fL (ref 80.0–100.0)
Platelets: 183 10*3/uL (ref 150–440)
RBC: 4.52 MIL/uL (ref 3.80–5.20)
RDW: 16.5 % — ABNORMAL HIGH (ref 11.5–14.5)
WBC: 5.4 10*3/uL (ref 3.6–11.0)

## 2015-07-09 LAB — TROPONIN I: TROPONIN I: 0.04 ng/mL — AB (ref <0.031)

## 2015-07-09 LAB — GLUCOSE, CAPILLARY
GLUCOSE-CAPILLARY: 335 mg/dL — AB (ref 65–99)
GLUCOSE-CAPILLARY: 264 mg/dL — AB (ref 65–99)
Glucose-Capillary: 282 mg/dL — ABNORMAL HIGH (ref 65–99)

## 2015-07-09 LAB — BASIC METABOLIC PANEL
Anion gap: 7 (ref 5–15)
BUN: 23 mg/dL — AB (ref 6–20)
CO2: 30 mmol/L (ref 22–32)
CREATININE: 1.34 mg/dL — AB (ref 0.44–1.00)
Calcium: 8.3 mg/dL — ABNORMAL LOW (ref 8.9–10.3)
Chloride: 100 mmol/L — ABNORMAL LOW (ref 101–111)
GFR calc Af Amer: 50 mL/min — ABNORMAL LOW (ref >60)
GFR, EST NON AFRICAN AMERICAN: 43 mL/min — AB (ref >60)
GLUCOSE: 320 mg/dL — AB (ref 65–99)
Potassium: 3.8 mmol/L (ref 3.5–5.1)
SODIUM: 137 mmol/L (ref 135–145)

## 2015-07-09 LAB — MAGNESIUM: MAGNESIUM: 1.4 mg/dL — AB (ref 1.7–2.4)

## 2015-07-09 MED ORDER — CALCIUM CARBONATE ANTACID 500 MG PO CHEW
1.0000 | CHEWABLE_TABLET | Freq: Two times a day (BID) | ORAL | Status: DC | PRN
  Administered 2015-07-09: 200 mg via ORAL
  Filled 2015-07-09 (×2): qty 1

## 2015-07-09 MED ORDER — SUCRALFATE 1 G PO TABS: 1.0000 g | ORAL_TABLET | Freq: Three times a day (TID) | ORAL | Status: DC

## 2015-07-09 MED ORDER — MAGNESIUM OXIDE 400 MG PO TABS: 400.0000 mg | ORAL_TABLET | Freq: Two times a day (BID) | ORAL | Status: DC

## 2015-07-09 NOTE — Care Management Note (Signed)
Case Management Note  Patient Details  Name: Audrey Hughes MRN: [id number] Date of Birth: Mar 07, 1956  Subjective/Objective:            Audrey Hughes was discharged home prior to receipt of Medicare Observation Status Notification form.        Action/Plan:   Expected Discharge Date:                  Expected Discharge Plan:     In-House Referral:     Discharge planning Services     Post Acute Care Choice:    Choice offered to:     DME Arranged:    DME Agency:     HH Arranged:    HH Agency:     Status of Service:     Medicare Important Message Given:    Date Medicare IM Given:    Medicare IM give by:    Date Additional Medicare IM Given:    Additional Medicare Important Message give by:     If discussed at Long Length of Stay Meetings, dates discussed:    Additional Comments:  Toniyah Dilmore A, RN 07/09/2015, 4:07 PM

## 2015-07-09 NOTE — Progress Notes (Signed)
Dr. Winona LegatoVaickute notified that ultrasound of BLE came back negative for DVT. MD stated to go ahead and send patient home.

## 2015-07-09 NOTE — Progress Notes (Signed)
[facility] Patient is being discharged. Instructions given to patient along with education on chest pain and new medication sulcrafate. Patient informed that ultrasound was negative. No further questions. Son has been called for transport. IV and tele discontinued. Patient is currently getting dressed, belongings have been packed up.

## 2015-07-09 NOTE — Consult Note (Signed)
[facility] CLINIC CARDIOLOGY [facility]  CARDIOLOGY CONSULT NOTE  Patient ID: Audrey Hughes MRN: [id number] DOB/AGE: 59-Feb-1957 59 y.o.  Admit date: 07/08/2015 Referring Physician Dr. Winona Legato Primary Physician [facility] Primary Cardiologist [facility] Reason for Consultation Chest pain  HPI: She is a 59 year old female with history of exogenous obesity as well as atypical chest pain. She was evaluated at [facility] in September of this year. At that time she had atrial flutter. She had a mild serum troponin elevation which was felt to be demand ischemia. She underwent a noninvasive ischemic workup showing what was felt to be possible apical scar with no reversible ischemia. She was not felt to have ruled in for a non-ST elevation myocardial infarction but had demand ischemia. She now is admitted with chest pain. It is somewhat atypical. She is ruled out for myocardial infarction. Electrocardiogram is nonischemic. She has had no anemia. EKG currently shows sinus rhythm at a rate of 73.  ROS Review of Systems - General ROS: positive for  - weight gain Respiratory ROS: no cough, shortness of breath, or wheezing Cardiovascular ROS: positive for - chest pain negative for - rapid heart rate Gastrointestinal ROS: no abdominal pain, change in bowel habits, or black or bloody stools Musculoskeletal ROS: negative Neurological ROS: no TIA or stroke symptoms   Past Medical History  Diagnosis Date  . Diabetes mellitus   . Asthma   . CAD (coronary artery disease)     s/p MI  . Depression   . HTN (hypertension)   . Tremor   . Paroxysmal atrial flutter   . GERD (gastroesophageal reflux disease)     Family History  Problem Relation Age of Onset  . Diabetes Mother   . Heart disease Mother   . COPD Father   . Hypertension Father     History   Social History  . Marital Status: Single    Spouse Name: N/A  . Number of Children: N/A  . Years of Education: N/A   Occupational History   . Not on file.   Social History Main Topics  . Smoking status: Never Smoker   . Smokeless tobacco: Not on file  . Alcohol Use: 0.0 oz/week    0 Standard drinks or equivalent per week     Comment: 1 can of beer  . Drug Use: No  . Sexual Activity: Not on file   Other Topics Concern  . Not on file   Social History Narrative  . No narrative on file    Past Surgical History  Procedure Laterality Date  . Cholecystectomy    . Cesarean section    . Abdominal hysterectomy    . Tonsillectomy and adenoidectomy    . Laparoscopic tubal ligation    . Joint replacement       Prescriptions prior to admission  Medication Sig Dispense Refill Last Dose  . albuterol (PROVENTIL HFA;VENTOLIN HFA) 108 (90 BASE) MCG/ACT inhaler Inhale 2 puffs into the lungs every 4 (four) hours as needed for wheezing or shortness of breath.     Marland Kitchen aspirin 81 MG tablet Take 81 mg by mouth daily.     . budesonide-formoterol (SYMBICORT) 160-4.5 MCG/ACT inhaler Inhale 2 puffs into the lungs 2 (two) times daily.     Marland Kitchen gabapentin (NEURONTIN) 300 MG capsule Take 300 mg by mouth 2 (two) times daily.     Marland Kitchen HYDROcodone-acetaminophen (NORCO) 10-325 MG per tablet Take 1 tablet by mouth 3 (three) times daily as  needed for severe pain.     . indapamide (LOZOL) 2.5 MG tablet Take 2.5 mg by mouth 2 (two) times daily.     . insulin glargine (LANTUS) 100 UNIT/ML injection Inject 48 Units into the skin 2 (two) times daily.     . insulin lispro (HUMALOG) 100 UNIT/ML injection Inject 35 Units into the skin daily with breakfast.      . insulin lispro (HUMALOG) 100 UNIT/ML injection Inject 45 Units into the skin daily with supper.     . metFORMIN (GLUCOPHAGE) 500 MG tablet Take by mouth 2 (two) times daily with a meal.     . metoprolol tartrate (LOPRESSOR) 25 MG tablet Take 12.5 mg by mouth 2 (two) times daily.     . pantoprazole (PROTONIX) 40 MG tablet Take 40 mg by mouth daily.     Marland Kitchen. PARoxetine (PAXIL) 40 MG tablet Take 40 mg by mouth  every morning.     . quinapril (ACCUPRIL) 10 MG tablet Take 10 mg by mouth 2 (two) times daily.     . rosuvastatin (CRESTOR) 10 MG tablet Take 10 mg by mouth daily.       Physical Exam: Blood pressure 135/60, pulse 72, temperature 97.8 F (36.6 C), temperature source Oral, resp. rate 18, height 6' (1.829 m), weight 181.348 kg (399 lb 12.8 oz), SpO2 100 %.    General appearance: alert and cooperative Resp: clear to auscultation bilaterally Chest wall: no tenderness, right sided costochondral tenderness, left sided costochondral tenderness Cardio: regular rate and rhythm, S1, S2 normal, no murmur, click, rub or gallop GI: soft, non-tender; bowel sounds normal; no masses,  no organomegaly Extremities: extremities normal, atraumatic, no cyanosis or edema Neurologic: Grossly normal Labs:   Lab Results  Component Value Date   WBC 5.4 07/09/2015   HGB 12.4 07/09/2015   HCT 38.7 07/09/2015   MCV 85.5 07/09/2015   PLT 183 07/09/2015    Recent Labs Lab 07/08/15 1938 07/09/15 0429  NA 136 137  K 4.2 3.8  CL 98* 100*  CO2 27 30  BUN 25* 23*  CREATININE 1.61* 1.34*  CALCIUM 8.2* 8.3*  PROT 7.9  --   BILITOT 0.9  --   ALKPHOS 82  --   ALT 23  --   AST 41  --   GLUCOSE 339* 320*   Lab Results  Component Value Date   TROPONINI 0.04* 07/09/2015      Radiology: No acute cardiopulmonary disease EKG: Normal sinus rhythm with no ischemia  ASSESSMENT AND PLAN:  Patient with history of atypical chest pain. She had a noninvasive [facility] earlier this year which revealed apical scarring with no reversible ischemia. She had atrial flutter on presentation at that admission. She was felt to of had demand ischemia secondary to her rapid heart rate. She continues to have chest pain which has atypical features. She is ruled out for myocardial infarction. She has no further pain. She appears to have had nonischemic pain during this visit. Would recommend ambulation and discharged  today with no further cardiac workup. Follow-up with her primary care physician at [facility] as recommended.  Signed: Dalia HeadingFATH,Adamae Ricklefs A. MD, [facility] 07/09/2015, 10:21 AM

## 2015-07-09 NOTE — Discharge Summary (Addendum)
[facility] [place] [facility]   PATIENT NAME: Audrey Hughes    MR#:  [id number]  DATE OF BIRTH:  22-Apr-1956  DATE OF ADMISSION:  07/08/2015 ADMITTING PHYSICIAN: Oralia Manis, MD  DATE OF DISCHARGE: No discharge date for patient encounter.  PRIMARY CARE PHYSICIAN: Hillery Aldo, MD     ADMISSION DIAGNOSIS:  Chest pain, unspecified chest pain type [R07.9]  DISCHARGE DIAGNOSIS:  Principal Problem:   Chest pain Active Problems:   Angina at rest   CAD (coronary artery disease)   Diabetes type 2, uncontrolled   GERD (gastroesophageal reflux disease)   Asthma   HTN (hypertension)   Depression   Paroxysmal atrial flutter   SECONDARY DIAGNOSIS:   Past Medical History  Diagnosis Date  . Diabetes mellitus   . Asthma   . CAD (coronary artery disease)     s/p MI  . Depression   . HTN (hypertension)   . Tremor   . Paroxysmal atrial flutter   . GERD (gastroesophageal reflux disease)     .pro HOSPITAL COURSE:   Patient is a 59 year old Caucasian female with history of coronary artery disease, diabetes mellitus, hypertension, hyperlipidemia who had extensive workup for chest pains in [facility] hospitals presents to the hospital with complaints of chest pain. She was described as diffuse chest discomfort radiating to her throat associated with some shortness of breath. Patient was advised to take aspirin after which, patient's pain is somewhat subsided and resolved now in the hospital. She still complaining of some gastroesophageal reflux. In emergency room, patient's troponin was normal, however, second set revealed mild elevation of troponin of 0.04*set was again normal. Labs showed a renal insufficiency with creatinine level of 1.6. D-dimer was elevated, but oxygenation was normal on usual doses of oxygen, so decision was made not to scan patient. Patient was seen by cardiologist, Dr. Ihor Austin recommended to ambulate patient and likely discharge her home  if her symptoms do not recur.  Discussion by problem *Chest pain. Cardiac enzymes were checked and patient's cardiac enzymes were unremarkable. Patient was seen by cardiologist, Dr. Lady Gary who did not feel that it was a heart attack despite the mild elevation of troponin of 0.04 on the second set. Patient is to follow-up with her primary care physician as well as cardiology at [facility] if needed *. Paroxysmal atrial flutter. Apparently patient has some palpitations but not captured on EKG patient was advised to continue metoprolol *Diabetes mellitus type 2, uncontrolled, patient was advised to continue diabetic diet and follow her glucose levels very closely. She is to follow-up with her primary care physician for further recommendations. Hemoglobin A1c was ordered, however, results are not obtained by the time of discharge *Gastroesophageal reflux disease. Continue patient on PPI and add Carafate *Hypertension, seems to be well controlled. Continue outpatient management *COPD/asthma. Continue home inhalers. No exacerbation *Depression. Continue outpatient medications. No changes *Renal insufficiency, likely chronic. Patient may benefit from nephrology consultation as outpatient * short episode of hypoxia, no DVT on LE  Doppler US  DISCHARGE CONDITIONS:   Stable  CONSULTS OBTAINED:  Treatment Team:  Dalia Heading, MD  DRUG ALLERGIES:   Allergies  Allergen Reactions  . Diltiazem Hives  . Flagyl [Metronidazole] Hives  . Sulfa Antibiotics Hives  . Toradol [Ketorolac Tromethamine] Hives    DISCHARGE MEDICATIONS:   Current Discharge Medication List    START taking these medications   Details  magnesium oxide (MAG-OX) 400 MG tablet Take 1 tablet (400 mg  total) by mouth 2 (two) times daily. Qty: 60 tablet, Refills: 2    sucralfate (CARAFATE) 1 G tablet Take 1 tablet (1 g total) by mouth 4 (four) times daily -  with meals and at bedtime. Qty: 90 tablet, Refills: 3      CONTINUE these  medications which have NOT CHANGED   Details  albuterol (PROVENTIL HFA;VENTOLIN HFA) 108 (90 BASE) MCG/ACT inhaler Inhale 2 puffs into the lungs every 4 (four) hours as needed for wheezing or shortness of breath.    aspirin 81 MG tablet Take 81 mg by mouth daily.    budesonide-formoterol (SYMBICORT) 160-4.5 MCG/ACT inhaler Inhale 2 puffs into the lungs 2 (two) times daily.    gabapentin (NEURONTIN) 300 MG capsule Take 300 mg by mouth 2 (two) times daily.    HYDROcodone-acetaminophen (NORCO) 10-325 MG per tablet Take 1 tablet by mouth 3 (three) times daily as needed for severe pain.    indapamide (LOZOL) 2.5 MG tablet Take 2.5 mg by mouth 2 (two) times daily.    insulin glargine (LANTUS) 100 UNIT/ML injection Inject 48 Units into the skin 2 (two) times daily.    !! insulin lispro (HUMALOG) 100 UNIT/ML injection Inject 35 Units into the skin daily with breakfast.     !! insulin lispro (HUMALOG) 100 UNIT/ML injection Inject 45 Units into the skin daily with supper.    metFORMIN (GLUCOPHAGE) 500 MG tablet Take by mouth 2 (two) times daily with a meal.    metoprolol tartrate (LOPRESSOR) 25 MG tablet Take 12.5 mg by mouth 2 (two) times daily.    pantoprazole (PROTONIX) 40 MG tablet Take 40 mg by mouth daily.    PARoxetine (PAXIL) 40 MG tablet Take 40 mg by mouth every morning.    quinapril (ACCUPRIL) 10 MG tablet Take 10 mg by mouth 2 (two) times daily.    rosuvastatin (CRESTOR) 10 MG tablet Take 10 mg by mouth daily.     !! - Potential duplicate medications found. Please discuss with provider.       DISCHARGE INSTRUCTIONS:    Follow-up with primary care physician and cardiology at [facility] for further recommendations, may show you're following a diet and blood glucose levels closely at home  If you experience worsening of your admission symptoms, develop shortness of breath, life threatening emergency, suicidal or homicidal thoughts you must seek medical attention  immediately by calling 911 or calling your MD immediately  if symptoms less severe.  You Must read complete instructions/literature along with all the possible adverse reactions/side effects for all the Medicines you take and that have been prescribed to you. Take any new Medicines after you have completely understood and accept all the possible adverse reactions/side effects.   Please note  You were cared for by a hospitalist during your hospital stay. If you have any questions about your discharge medications or the care you received while you were in the hospital after you are discharged, you can call the unit and asked to speak with the hospitalist on call if the hospitalist that took care of you is not available. Once you are discharged, your primary care physician will handle any further medical issues. Please note that NO REFILLS for any discharge medications will be authorized once you are discharged, as it is imperative that you return to your primary care physician (or establish a relationship with a primary care physician if you do not have one) for your aftercare needs so that they can reassess your need for  medications and monitor your lab values.    Today   CHIEF COMPLAINT:   Chief Complaint  Patient presents with  . Chest Pain    HISTORY OF PRESENT ILLNESS:  Audrey Hughes  is a 60 y.o. female with a known history of coronary artery disease, diabetes mellitus, hypertension, hyperlipidemia who had extensive workup for chest pains in [facility] hospitals presents to the hospital with complaints of chest pain. She was described as diffuse chest discomfort radiating to her throat associated with some shortness of breath. Patient was advised to take aspirin after which, patient's pain is somewhat subsided and resolved now in the hospital. She still complaining of some gastroesophageal reflux. In emergency room, patient's troponin was normal, however, second set revealed mild elevation  of troponin of 0.04*set was again normal. Labs showed a renal insufficiency with creatinine level of 1.6. D-dimer was elevated, but oxygenation was normal on usual doses of oxygen, so decision was made not to scan patient. Patient was seen by cardiologist, Dr. Ihor Austin recommended to ambulate patient and likely discharge her home if her symptoms do not recur.  Discussion by problem *Chest pain. Cardiac enzymes were checked and patient's cardiac enzymes were unremarkable. Patient was seen by cardiologist, Dr. Lady Gary who did not feel that it was a heart attack despite the mild elevation of troponin of 0.04 on the second set. Patient is to follow-up with her primary care physician as well as cardiology at [facility] if needed *. Paroxysmal atrial flutter. Apparently patient has some palpitations but not captured on EKG patient was advised to continue metoprolol *Diabetes mellitus type 2, uncontrolled, patient was advised to continue diabetic diet and follow her glucose levels very closely. She is to follow-up with her primary care physician for further recommendations. Hemoglobin A1c was ordered, however, results are not obtained by the time of discharge *Gastroesophageal reflux disease. Continue patient on PPI and add Carafate *Hypertension, seems to be well controlled. Continue outpatient management *COPD/asthma. Continue home inhalers. No exacerbation *Depression. Continue outpatient medications. No changes *Renal insufficiency, likely chronic, stable. Needs to be evaluated as outpatient by nephrologist *short episode of hypoxia, no DVT on LE  Doppler US   VITAL SIGNS:  Blood pressure 135/60, pulse 72, temperature 97.8 F (36.6 C), temperature source Oral, resp. rate 18, height 6' (1.829 m), weight 181.348 kg (399 lb 12.8 oz), SpO2 100 %.  I/O:   Intake/Output Summary (Last 24 hours) at 07/09/15 1057 Last data filed at 07/09/15 0947  Gross per 24 hour  Intake      0 ml  Output    600 ml  Net   -600 ml     PHYSICAL EXAMINATION:  GENERAL:  59 y.o.-year-old patient lying in the bed with no acute distress.  EYES: Pupils equal, round, reactive to light and accommodation. No scleral icterus. Extraocular muscles intact.  HEENT: Head atraumatic, normocephalic. Oropharynx and nasopharynx clear.  NECK:  Supple, no jugular venous distention. No thyroid enlargement, no tenderness.  LUNGS: Normal breath sounds bilaterally, no wheezing, rales,rhonchi or crepitation. No use of accessory muscles of respiration.  CARDIOVASCULAR: S1, S2 normal. No murmurs, rubs, or gallops.  ABDOMEN: Soft, non-tender, non-distended. Bowel sounds present. No organomegaly or mass.  EXTREMITIES: No pedal edema, cyanosis, or clubbing.  NEUROLOGIC: Cranial nerves II through XII are intact. Muscle strength 5/5 in all extremities. Sensation intact. Gait not checked.  PSYCHIATRIC: The patient is alert and oriented x 3.  SKIN: No obvious rash, lesion, or ulcer.   DATA  REVIEW:   CBC  Recent Labs Lab 07/09/15 0429  WBC 5.4  HGB 12.4  HCT 38.7  PLT 183    Chemistries   Recent Labs Lab 07/08/15 1938 07/09/15 0429  NA 136 137  K 4.2 3.8  CL 98* 100*  CO2 27 30  GLUCOSE 339* 320*  BUN 25* 23*  CREATININE 1.61* 1.34*  CALCIUM 8.2* 8.3*  MG  --  1.4*  AST 41  --   ALT 23  --   ALKPHOS 82  --   BILITOT 0.9  --     Cardiac Enzymes  Recent Labs Lab 07/09/15 0952  TROPONINI <0.03    Microbiology Results  No results found for this or any previous visit.  RADIOLOGY:  Dg Chest Portable 1 View  07/08/2015   CLINICAL DATA:  Chest pain, shortness of breath.  EXAM: PORTABLE CHEST - 1 VIEW  COMPARISON:  February 04, 2015.  FINDINGS: Stable cardiomediastinal silhouette. No pneumothorax or pleural effusion is noted. Both lungs are clear. The visualized skeletal structures are unremarkable.  IMPRESSION: No acute cardiopulmonary abnormality seen.   Electronically Signed   By: Lupita Raider, M.D.   On: 07/08/2015 20:02     EKG:   Orders placed or performed during the hospital encounter of 07/08/15  . ED EKG (<60mins upon arrival to the ED)  . ED EKG (<27mins upon arrival to the ED)  . EKG 12-Lead  . EKG 12-Lead      Management plans discussed with the patient, family and they are in agreement.  CODE STATUS:     Code Status Orders        Start     Ordered   07/08/15 2209  Full code   Continuous     07/08/15 2208      TOTAL TIME TAKING CARE OF THIS PATIENT: 45 minutes.    Katharina Caper M.D on 07/09/2015 at 10:57 AM  Between 7am to 6pm - Pager - [phone]  After 6pm go to [email or web address] - password [email or web address] [facility]  [place] [facility]  Office  [phone]  CC: Primary care physician; Hillery Aldo, MD

## 2015-07-09 NOTE — Progress Notes (Signed)
Patient discharged before insulin was given. RN was unaware that patient's ride arrived. Patient will continue home diabetes medications.

## 2015-07-10 LAB — HEMOGLOBIN A1C: Hgb A1c MFr Bld: 9.3 % — ABNORMAL HIGH (ref 4.0–6.0)

## 2015-11-08 ENCOUNTER — Ambulatory Visit: Payer: Medicare Other

## 2015-11-15 ENCOUNTER — Inpatient Hospital Stay: Payer: Medicare Other | Attending: Obstetrics and Gynecology | Admitting: Obstetrics and Gynecology

## 2015-11-15 VITALS — BP 115/79 | HR 85 | Temp 99.6°F | Ht 72.0 in | Wt 398.6 lb

## 2015-11-15 DIAGNOSIS — Z794 Long term (current) use of insulin: Secondary | ICD-10-CM

## 2015-11-15 DIAGNOSIS — I25119 Atherosclerotic heart disease of native coronary artery with unspecified angina pectoris: Secondary | ICD-10-CM

## 2015-11-15 DIAGNOSIS — J449 Chronic obstructive pulmonary disease, unspecified: Secondary | ICD-10-CM | POA: Diagnosis not present

## 2015-11-15 DIAGNOSIS — Z90710 Acquired absence of both cervix and uterus: Secondary | ICD-10-CM

## 2015-11-15 DIAGNOSIS — Z6841 Body Mass Index (BMI) 40.0 and over, adult: Secondary | ICD-10-CM

## 2015-11-15 DIAGNOSIS — I10 Essential (primary) hypertension: Secondary | ICD-10-CM

## 2015-11-15 DIAGNOSIS — N8502 Endometrial intraepithelial neoplasia [EIN]: Secondary | ICD-10-CM | POA: Insufficient documentation

## 2015-11-15 DIAGNOSIS — E669 Obesity, unspecified: Secondary | ICD-10-CM

## 2015-11-15 DIAGNOSIS — I4892 Unspecified atrial flutter: Secondary | ICD-10-CM

## 2015-11-15 DIAGNOSIS — E119 Type 2 diabetes mellitus without complications: Secondary | ICD-10-CM

## 2015-11-15 MED ORDER — MEGESTROL ACETATE 40 MG PO TABS: 40.0000 mg | ORAL_TABLET | Freq: Two times a day (BID) | ORAL | Status: DC

## 2015-11-15 NOTE — Progress Notes (Signed)
Assisted MD with pelvic exam

## 2015-11-15 NOTE — Progress Notes (Signed)
Gynecologic Oncology Consult Visit   Referring Provider: Burnett Corrente  Chief Concern: EIN  Subjective:  Audrey Hughes is a 59 y.o. morbidly obese G7P3 female who is seen in consultation from Dr. Tildon Husky for EIN.  Menopause in early 78s.  She had some bleeding in August with pain and cramping.  Prior benign endometrial bx in 2012 with polyp. SIS did not reveal any remaining polyp.  Endometrial bx 09/18/15 by Dr Tildon Husky showed complex endometrial hyperplasia with atypia (EIN). No bleeding now.  Problem List: Patient Active Problem List   Diagnosis Date Noted  . 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 Chest pain 07/09/2015  . Angina at rest [facility]) 07/08/2015  . CAD (coronary artery disease) 07/08/2015  . Diabetes type 2, uncontrolled (HCC) 07/08/2015  . Asthma 07/08/2015  . HTN (hypertension) 07/08/2015  . Depression 07/08/2015  . Paroxysmal atrial flutter (HCC) 07/08/2015  . GERD (gastroesophageal reflux disease) 07/08/2015    Past Medical History: Past Medical History  Diagnosis Date  . Diabetes mellitus   . Asthma   . CAD (coronary artery disease)     s/p MI  . Depression   . HTN (hypertension)   . Tremor   . Paroxysmal atrial flutter   . GERD (gastroesophageal reflux disease)     Past Surgical History: Past Surgical History  Procedure Laterality Date  . Cholecystectomy    . Cesarean section    . Abdominal hysterectomy    . Tonsillectomy and adenoidectomy    . Laparoscopic tubal ligation    . Joint replacement      Past Gynecologic History:  History of Abnormal pap: no, no abnormalities History of STDs: The patient denies history of sexually transmitted disease. Contraception: no method Sexually active: no  OB History:  OB History  No data available    Family History: Family History  Problem Relation Age of Onset  . Diabetes Mother   . Heart disease Mother   . COPD Father   . Hypertension Father     Social History: Social History    Social History  . Marital Status: Single    Spouse Name: N/A  . Number of Children: N/A  . Years of Education: N/A   Occupational History  . Not on file.   Social History Main Topics  . Smoking status: Never Smoker   . Smokeless tobacco: Not on file  . Alcohol Use: 0.0 oz/week    0 Standard drinks or equivalent per week     Comment: 1 can of beer  . Drug Use: No  . Sexual Activity: Not on file   Other Topics Concern  . Not on file   Social History Narrative  . No narrative on file    Allergies: Allergies  Allergen Reactions  . Diltiazem Hives  . Flagyl [Metronidazole] Hives  . Sulfa Antibiotics Hives  . Toradol [Ketorolac Tromethamine] Hives    Current Medications: Current Outpatient Prescriptions  Medication Sig Dispense Refill  . albuterol (PROVENTIL HFA;VENTOLIN HFA) 108 (90 BASE) MCG/ACT inhaler Inhale 2 puffs into the lungs every 4 (four) hours as needed for wheezing or shortness of breath.    Marland Kitchen aspirin 81 MG tablet Take 81 mg by mouth daily.    . budesonide-formoterol (SYMBICORT) 160-4.5 MCG/ACT inhaler Inhale 2 puffs into the lungs 2 (two) times daily.    Marland Kitchen gabapentin (NEURONTIN) 300 MG capsule Take 300 mg by mouth 2 (two) times daily.    Marland Kitchen HYDROcodone-acetaminophen (NORCO) 10-325 MG per tablet Take 1 tablet  by mouth 3 (three) times daily as needed for severe pain.    . indapamide (LOZOL) 2.5 MG tablet Take 2.5 mg by mouth 2 (two) times daily.    . insulin glargine (LANTUS) 100 UNIT/ML injection Inject 48 Units into the skin 2 (two) times daily.    . insulin lispro (HUMALOG) 100 UNIT/ML injection Inject 35 Units into the skin daily with breakfast.     . insulin lispro (HUMALOG) 100 UNIT/ML injection Inject 45 Units into the skin daily with supper.    . magnesium oxide (MAG-OX) 400 MG tablet Take 1 tablet (400 mg total) by mouth 2 (two) times daily. 60 tablet 2  . metFORMIN (GLUCOPHAGE) 500 MG tablet Take by mouth 2 (two) times daily with a meal.    .  metoprolol tartrate (LOPRESSOR) 25 MG tablet Take 12.5 mg by mouth 2 (two) times daily.    . pantoprazole (PROTONIX) 40 MG tablet Take 40 mg by mouth daily.    Marland Kitchen. PARoxetine (PAXIL) 40 MG tablet Take 40 mg by mouth every morning.    . quinapril (ACCUPRIL) 10 MG tablet Take 10 mg by mouth 2 (two) times daily.    . rosuvastatin (CRESTOR) 10 MG tablet Take 10 mg by mouth daily.    . sucralfate (CARAFATE) 1 G tablet Take 1 tablet (1 g total) by mouth 4 (four) times daily -  with meals and at bedtime. 90 tablet 3   No current facility-administered medications for this visit.    Review of Systems General: negative for, fevers, chills, fatigue, changes in sleep, changes in weight or appetite Skin: negative for changes in color, texture, moles or lesions Eyes: negative for, changes in vision, pain, diplopia HEENT: negative for, change in hearing, pain, discharge, tinnitus, vertigo, voice changes, sore throat, neck masses Breasts: negative for breast lumps Pulmonary: negative for, dyspnea, orthopnea, productive cough Cardiac: negative for, palpitations, syncope, pain, discomfort, pressure Gastrointestinal: negative for, dysphagia, nausea, vomiting, jaundice, pain, constipation, diarrhea, hematemesis, hematochezia Genitourinary/Sexual: negative for, dysuria, discharge, hesitancy, nocturia, retention, stones, infections, STD's, incontinence Ob/Gyn: negative for, irregular bleeding, pain Musculoskeletal: negative for, pain, stiffness, swelling, range of motion limitation Hematology: No coagulation disorder, negative for, easy bruising, bleeding Neurologic/Psych: negative for, seizures, paralysis, weakness, tremor, change in gait, change in sensation, mood swings, depression, anxiety, change in memory  Objective:  Physical Examination:  BP 115/79 mmHg  Pulse 85  Temp(Src) 99.6 F (37.6 C) (Oral)  Ht 6' (1.829 m)  Wt 398 lb 9.5 oz (180.8 kg)  BMI 54.05 kg/m2   ECOG Performance Status: 1 -  Symptomatic but completely ambulatory  General appearance: alert, cooperative and appears stated age HEENT:PERRLA, neck supple with midline trachea and thyroid without masses Lymph node survey: non-palpable, axillary, inguinal, supraclavicular Cardiovascular: regular rate and rhythm, no murmurs or gallops Respiratory: normal air entry, lungs clear to auscultation and no rales, rhonchi or wheezing Breast exam: breasts appear normal, no suspicious masses, no skin or nipple changes or axillary nodes, patient declines to have breast exam. Abdomen: soft, non-tender, without masses or organomegaly and no hernias Back: inspection of back is normal Extremities: extremities normal, atraumatic, no cyanosis or edema Skin exam - normal coloration and turgor, no rashes, no suspicious skin lesions noted. Neurological exam reveals alert, oriented, normal speech, no focal findings or movement disorder noted.  Pelvic: exam chaperoned by nurse;  Vulva: normal appearing vulva with no masses, tenderness or lesions; Vagina: normal vagina; Adnexa: normal adnexa in size, nontender and no masses; Uterus: uterus is  normal size, shape, consistency and nontender; Cervix: anteverted; Rectal: not indicated and normal rectal, no masses    Assessment:  Audrey Hughes is a 59 y.o. female diagnosed with post menopausal bleeding and EIN.  She is not bleeding anymore.    Multiple medical problems including Morbid Obesity BMI=56, uncontrolled type 2 DM, CAD, COPD  Plan:   Problem List Items Addressed This Visit      Genitourinary   EIN (endometrial intraepithelial neoplasia) - Primary     We discussed options for management including hysterectomy, oral progestins, Mirena IUD.  We would favor hormonal management in view of her morbid obesity and other medical issues. Patient would like to try Megace and we will prescribe 40 mg bid.  Reviewed potential side effects. Suggested return to clinic in  4 months to see [place] and the  goal will be to suppress all bleeding.  If this is the case, we do not need to repeat biopsy.  If she is doing well in 4 months we will refer her back to [place] [facility] [place] [facility].   Leida Lauth, MD  CC:  Hillery Aldo, MD [address] [place], [place] [address] [phone]

## 2016-02-07 ENCOUNTER — Other Ambulatory Visit: Payer: Self-pay | Admitting: Internal Medicine

## 2016-02-07 DIAGNOSIS — M7989 Other specified soft tissue disorders: Secondary | ICD-10-CM

## 2016-02-08 ENCOUNTER — Ambulatory Visit
Admission: RE | Admit: 2016-02-08 | Discharge: 2016-02-08 | Disposition: A | Discharge status: Home / Self Care | Payer: Medicare Other | Source: Ambulatory Visit | Attending: Internal Medicine | Admitting: Internal Medicine

## 2016-02-08 DIAGNOSIS — M7989 Other specified soft tissue disorders: Secondary | ICD-10-CM | POA: Insufficient documentation

## 2016-02-09 ENCOUNTER — Encounter: Payer: Self-pay | Admitting: Emergency Medicine

## 2016-02-09 ENCOUNTER — Emergency Department: Payer: Medicare Other

## 2016-02-09 ENCOUNTER — Emergency Department
Admission: EM | Admit: 2016-02-09 | Discharge: 2016-02-09 | Disposition: A | Discharge status: Home / Self Care | Payer: Medicare Other | Attending: Emergency Medicine | Admitting: Emergency Medicine

## 2016-02-09 DIAGNOSIS — Z79899 Other long term (current) drug therapy: Secondary | ICD-10-CM | POA: Diagnosis not present

## 2016-02-09 DIAGNOSIS — M79662 Pain in left lower leg: Secondary | ICD-10-CM | POA: Diagnosis not present

## 2016-02-09 DIAGNOSIS — R51 Headache: Secondary | ICD-10-CM | POA: Diagnosis not present

## 2016-02-09 DIAGNOSIS — G8929 Other chronic pain: Secondary | ICD-10-CM | POA: Diagnosis not present

## 2016-02-09 DIAGNOSIS — E119 Type 2 diabetes mellitus without complications: Secondary | ICD-10-CM | POA: Insufficient documentation

## 2016-02-09 DIAGNOSIS — Z794 Long term (current) use of insulin: Secondary | ICD-10-CM | POA: Diagnosis not present

## 2016-02-09 DIAGNOSIS — H53149 Visual discomfort, unspecified: Secondary | ICD-10-CM | POA: Insufficient documentation

## 2016-02-09 DIAGNOSIS — Z7984 Long term (current) use of oral hypoglycemic drugs: Secondary | ICD-10-CM | POA: Insufficient documentation

## 2016-02-09 DIAGNOSIS — I10 Essential (primary) hypertension: Secondary | ICD-10-CM | POA: Insufficient documentation

## 2016-02-09 DIAGNOSIS — R209 Unspecified disturbances of skin sensation: Secondary | ICD-10-CM | POA: Insufficient documentation

## 2016-02-09 DIAGNOSIS — R600 Localized edema: Secondary | ICD-10-CM | POA: Insufficient documentation

## 2016-02-09 DIAGNOSIS — Z7951 Long term (current) use of inhaled steroids: Secondary | ICD-10-CM | POA: Insufficient documentation

## 2016-02-09 DIAGNOSIS — R519 Headache, unspecified: Secondary | ICD-10-CM

## 2016-02-09 DIAGNOSIS — Z7982 Long term (current) use of aspirin: Secondary | ICD-10-CM | POA: Insufficient documentation

## 2016-02-09 HISTORY — DX: Parkinson's disease without dyskinesia, without mention of fluctuations: G20.A1

## 2016-02-09 HISTORY — DX: Obstructive sleep apnea (adult) (pediatric): G47.33

## 2016-02-09 HISTORY — DX: Parkinson's disease: G20

## 2016-02-09 LAB — SEDIMENTATION RATE: SED RATE: 46 mm/h — AB (ref 0–30)

## 2016-02-09 LAB — CBC
HEMATOCRIT: 40.4 % (ref 35.0–47.0)
HEMOGLOBIN: 13 g/dL (ref 12.0–16.0)
MCH: 26.6 pg (ref 26.0–34.0)
MCHC: 32.2 g/dL (ref 32.0–36.0)
MCV: 82.5 fL (ref 80.0–100.0)
Platelets: 169 10*3/uL (ref 150–440)
RBC: 4.91 MIL/uL (ref 3.80–5.20)
RDW: 16.5 % — ABNORMAL HIGH (ref 11.5–14.5)
WBC: 5.9 10*3/uL (ref 3.6–11.0)

## 2016-02-09 LAB — COMPREHENSIVE METABOLIC PANEL
ALK PHOS: 53 U/L (ref 38–126)
ALT: 25 U/L (ref 14–54)
ANION GAP: 6 (ref 5–15)
AST: 47 U/L — ABNORMAL HIGH (ref 15–41)
Albumin: 3.3 g/dL — ABNORMAL LOW (ref 3.5–5.0)
BILIRUBIN TOTAL: 1.2 mg/dL (ref 0.3–1.2)
BUN: 18 mg/dL (ref 6–20)
CO2: 30 mmol/L (ref 22–32)
Calcium: 8.9 mg/dL (ref 8.9–10.3)
Chloride: 102 mmol/L (ref 101–111)
Creatinine, Ser: 1.31 mg/dL — ABNORMAL HIGH (ref 0.44–1.00)
GFR calc non Af Amer: 44 mL/min — ABNORMAL LOW (ref >60)
GFR, EST AFRICAN AMERICAN: 51 mL/min — AB (ref >60)
Glucose, Bld: 331 mg/dL — ABNORMAL HIGH (ref 65–99)
POTASSIUM: 3.8 mmol/L (ref 3.5–5.1)
Sodium: 138 mmol/L (ref 135–145)
TOTAL PROTEIN: 7.7 g/dL (ref 6.5–8.1)

## 2016-02-09 LAB — LIPASE, BLOOD: LIPASE: 25 U/L (ref 11–51)

## 2016-02-09 MED ORDER — ONDANSETRON HCL 4 MG/2ML IJ SOLN
4.0000 mg | Freq: Once | INTRAMUSCULAR | Status: AC
  Administered 2016-02-09: 4 mg via INTRAVENOUS
  Filled 2016-02-09: qty 2

## 2016-02-09 MED ORDER — DIAZEPAM 5 MG/ML IJ SOLN
5.0000 mg | INTRAMUSCULAR | Status: AC
  Administered 2016-02-09: 5 mg via INTRAVENOUS
  Filled 2016-02-09: qty 2

## 2016-02-09 MED ORDER — ONDANSETRON 4 MG PO TBDP
4.0000 mg | ORAL_TABLET | Freq: Four times a day (QID) | ORAL | Status: DC | PRN
Start: 2016-02-09 — End: 2017-03-05

## 2016-02-09 MED ORDER — SODIUM CHLORIDE 0.9 % IV BOLUS (SEPSIS)
500.0000 mL | Freq: Once | INTRAVENOUS | Status: AC
  Administered 2016-02-09: 500 mL via INTRAVENOUS

## 2016-02-09 MED ORDER — VALPROATE SODIUM 500 MG/5ML IV SOLN
500.0000 mg | INTRAVENOUS | Status: AC
  Administered 2016-02-09: 500 mg via INTRAVENOUS
  Filled 2016-02-09: qty 5

## 2016-02-09 MED ORDER — TRAMADOL HCL 50 MG PO TABS: 50.0000 mg | ORAL_TABLET | Freq: Four times a day (QID) | ORAL | Status: DC | PRN

## 2016-02-09 MED ORDER — MORPHINE SULFATE (PF) 4 MG/ML IV SOLN
4.0000 mg | Freq: Once | INTRAVENOUS | Status: AC
  Administered 2016-02-09: 4 mg via INTRAVENOUS
  Filled 2016-02-09: qty 1

## 2016-02-09 NOTE — Discharge Instructions (Signed)
You have been seen in the Emergency Department (ED) for a headache.  Please use Tylenol or Ultram (tramadol) as needed for symptoms, but only as written on the box or prescription.   As we have discussed, please follow up with your primary care doctor as soon as possible regarding todays Emergency Department (ED) visit and your headache symptoms.    Call your doctor or return to the ED if you have a worsening headache, a FEVER or stiff neck, sudden and severe headache, confusion, slurred speech, facial droop, weakness or numbness in any arm or leg, extreme fatigue, vision problems, or other symptoms that concern you.   General Headache Without Cause A headache is pain or discomfort felt around the head or neck area. There are many causes and types of headaches. In some cases, the cause may not be found.  HOME CARE  Managing Pain  Take over-the-counter and prescription medicines only as told by your doctor.  Lie down in a dark, quiet room when you have a headache.  If directed, apply ice to the head and neck area:  Put ice in a plastic bag.  Place a towel between your skin and the bag.  Leave the ice on for 20 minutes, 2-3 times per day.  Use a heating pad or hot shower to apply heat to the head and neck area as told by your doctor.  Keep lights dim if bright lights bother you or make your headaches worse. Eating and Drinking  Eat meals on a regular schedule.  Lessen how much alcohol you drink.  Lessen how much caffeine you drink, or stop drinking caffeine. General Instructions  Keep all follow-up visits as told by your doctor. This is important.  Keep a journal to find out if certain things bring on headaches. For example, write down:  What you eat and drink.  How much sleep you get.  Any change to your diet or medicines.  Relax by getting a massage or doing other relaxing activities.  Lessen stress.  Sit up straight. Do not tighten (tense) your muscles.  Do not  use tobacco products. This includes cigarettes, chewing tobacco, or e-cigarettes. If you need help quitting, ask your doctor.  Exercise regularly as told by your doctor.  Get enough sleep. This often means 7-9 hours of sleep. GET HELP IF:  Your symptoms are not helped by medicine.  You have a headache that feels different than the other headaches.  You feel sick to your stomach (nauseous) or you throw up (vomit).  You have a fever. GET HELP RIGHT AWAY IF:   Your headache becomes really bad.  You keep throwing up.  You have a stiff neck.  You have trouble seeing.  You have trouble speaking.  You have pain in the eye or ear.  Your muscles are weak or you lose muscle control.  You lose your balance or have trouble walking.  You feel like you will pass out (faint) or you pass out.  You have confusion.   This information is not intended to replace advice given to you by your health care provider. Make sure you discuss any questions you have with your health care provider.   Document Released: 09/24/2008 Document Revised: 09/06/2015 Document Reviewed: 04/10/2015 Elsevier Interactive Patient Education [employer]

## 2016-02-09 NOTE — ED Notes (Signed)
Patient brought in by [facility] for c/o headache that started on Monday. Patient is also c/o nausea and vomiting, and left leg pain. Patient currently in NAD

## 2016-02-09 NOTE — ED Provider Notes (Signed)
[facility] Emergency Department Provider Note  ____________________________________________  Time seen: Approximately 2:05 PM  I have reviewed the triage vital signs and the nursing notes.   HISTORY  Chief Complaint Headache    HPI Audrey Hughes is a 60 y.o. female presents for evaluation of a headache which she's had for about the last 5 days.  The patient reports she has occasional headaches, usually frontal and throbbing in nature. She reports this headache is similar and she has sensitivity to light and sound. Denies any fever or neck stiffness.  Patient reports she had similar headaches previous, this seems to be a little worse than normal. He was not sudden and maximal in onset but is rather gotten a little bit worse over the last couple days.  Associated with some mild nausea and did vomit once on Monday. She does report that she had a slight abdominal discomfort which is resolved now.  No fevers or chills. No chest pain or trouble breathing. She does report some discomfort and pain in her left lower leg which is been a chronic and ongoing process. No changes there.   Past Medical History  Diagnosis Date  . Diabetes mellitus ([place])   . Asthma   . CAD (coronary artery disease)     s/p MI  . Depression   . HTN (hypertension)   . Tremor   . Paroxysmal atrial flutter ([place])   . GERD (gastroesophageal reflux disease)   . OSA (obstructive sleep apnea)   . Parkinson disease [facility])     Patient Active Problem List   Diagnosis Date Noted  . 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 Chest pain 07/09/2015  . Angina at rest [facility]) 07/08/2015  . CAD (coronary artery disease) 07/08/2015  . Diabetes type 2, uncontrolled ([place]) 07/08/2015  . Asthma 07/08/2015  . HTN (hypertension) 07/08/2015  . Depression 07/08/2015  . Paroxysmal atrial flutter ([place]) 07/08/2015  . GERD (gastroesophageal reflux disease) 07/08/2015    Past Surgical  History  Procedure Laterality Date  . Cholecystectomy    . Cesarean section    . Tonsillectomy and adenoidectomy    . Laparoscopic tubal ligation    . Joint replacement      Current Outpatient Rx  Name  Route  Sig  Dispense  Refill  . albuterol (PROVENTIL HFA;VENTOLIN HFA) 108 (90 BASE) MCG/ACT inhaler   Inhalation   Inhale 2 puffs into the lungs every 4 (four) hours as needed for wheezing or shortness of breath.         Marland Kitchen aspirin 81 MG tablet   Oral   Take 81 mg by mouth daily.         . budesonide-formoterol (SYMBICORT) 160-4.5 MCG/ACT inhaler   Inhalation   Inhale 2 puffs into the lungs 2 (two) times daily.         Marland Kitchen gabapentin (NEURONTIN) 300 MG capsule   Oral   Take 300 mg by mouth 2 (two) times daily.         Marland Kitchen HYDROcodone-acetaminophen (NORCO) 10-325 MG per tablet   Oral   Take 1 tablet by mouth 3 (three) times daily as needed for severe pain.         . indapamide (LOZOL) 2.5 MG tablet   Oral   Take 2.5 mg by mouth 2 (two) times daily.         . insulin glargine (LANTUS) 100 UNIT/ML injection   Subcutaneous   Inject 48 Units into the skin 2 (two)  times daily.         . insulin lispro (HUMALOG) 100 UNIT/ML injection   Subcutaneous   Inject 35 Units into the skin daily with breakfast.          . insulin lispro (HUMALOG) 100 UNIT/ML injection   Subcutaneous   Inject 45 Units into the skin daily with supper.         . megestrol (MEGACE) 40 MG tablet   Oral   Take 1 tablet (40 mg total) by mouth 2 (two) times daily.   60 tablet   12   . metFORMIN (GLUCOPHAGE) 500 MG tablet   Oral   Take by mouth 2 (two) times daily with a meal.         . ondansetron (ZOFRAN ODT) 4 MG disintegrating tablet   Oral   Take 1 tablet (4 mg total) by mouth every 6 (six) hours as needed for nausea or vomiting.   20 tablet   0   . pantoprazole (PROTONIX) 40 MG tablet   Oral   Take 40 mg by mouth daily.         Marland Kitchen PARoxetine (PAXIL) 40 MG tablet    Oral   Take 40 mg by mouth every morning.         . quinapril (ACCUPRIL) 10 MG tablet   Oral   Take 10 mg by mouth 2 (two) times daily.         . rosuvastatin (CRESTOR) 10 MG tablet   Oral   Take 10 mg by mouth daily.         . traMADol (ULTRAM) 50 MG tablet   Oral   Take 1 tablet (50 mg total) by mouth every 6 (six) hours as needed.   20 tablet   0     Allergies Diltiazem; Flagyl; Sulfa antibiotics; and Toradol  Family History  Problem Relation Age of Onset  . Diabetes Mother   . Heart disease Mother   . COPD Father   . Hypertension Father     Social History Social History  Substance Use Topics  . Smoking status: Never Smoker   . Smokeless tobacco: None  . Alcohol Use: No     Comment: 1 can of beer    Review of Systems Constitutional: No fever/chills Eyes: No visual changes. ENT: No sore throat. Bright lights to make the headache slightly worse. Cardiovascular: Denies chest pain. Respiratory: Denies shortness of breath. Gastrointestinal: No abdominal pain.  No diarrhea.  No constipation. Genitourinary: Negative for dysuria. Musculoskeletal: Negative for back pain. Skin: Negative for rash. Neurological: Negative for focal weakness or numbness.  10-point ROS otherwise negative.  ____________________________________________   PHYSICAL EXAM:  VITAL SIGNS: ED Triage Vitals  Enc Vitals Group     BP 02/09/16 1036 137/73 mmHg     Pulse Rate 02/09/16 1036 101     Resp 02/09/16 1036 18     Temp 02/09/16 1036 98.2 F (36.8 C)     Temp Source 02/09/16 1036 Oral     SpO2 02/09/16 1036 100 %     Weight 02/09/16 1036 386 lb (175.088 kg)     Height 02/09/16 1036 6' (1.829 m)     Head Cir --      Peak Flow --      Pain Score 02/09/16 1037 8     Pain Loc --      Pain Edu? --      Excl. in [place]? --    Constitutional: Alert  and oriented. Well appearing and in no acute distress. Eyes: Conjunctivae are normal. PERRL. EOMI. the patient does have mild  photophobia. Head: Atraumatic. No temporal artery tenderness bilateral. Normal temporal artery pulsations bilateral. Nose: No congestion/rhinnorhea. Mouth/Throat: Mucous membranes are moist.  Oropharynx non-erythematous. Neck: No stridor.   No meningismus. Full range of motion of the neck without worsening headache. Negative jolt accentuation. Cardiovascular: Normal rate, regular rhythm. Grossly normal heart sounds.  Good peripheral circulation. Respiratory: Normal respiratory effort.  No retractions. Lungs CTAB. Gastrointestinal: Soft and nontender. Notably obese though. No CVA tenderness. Musculoskeletal: No lower extremity tenderness with 2+ lower extremity edema bilateral. Neurologic:  Normal speech and language. No gross focal neurologic deficits are appreciated. No pronator drift. 5 out of 5 strength in all extremity. Normal sensation on extremities except for a stocking glove type loss of sensation over the lower legs which the patient reports chronic. No facial droop. Normal coordination in upper extremities bilateral. Skin:  Skin is warm, dry and intact. No rash noted. Psychiatric: Mood and affect are normal. Speech and behavior are normal.  ____________________________________________   LABS (all labs ordered are listed, but only abnormal results are displayed)  Labs Reviewed  CBC - Abnormal; Notable for the following:    RDW 16.5 (*)    All other components within normal limits  COMPREHENSIVE METABOLIC PANEL - Abnormal; Notable for the following:    Glucose, Bld 331 (*)    Creatinine, Ser 1.31 (*)    Albumin 3.3 (*)    AST 47 (*)    GFR calc non Af Amer 44 (*)    GFR calc Af Amer 51 (*)    All other components within normal limits  SEDIMENTATION RATE - Abnormal; Notable for the following:    Sed Rate 46 (*)    All other components within normal limits  LIPASE, BLOOD    ____________________________________________  EKG   ____________________________________________  RADIOLOGY  CT Head Wo Contrast (Final result) Result time: 02/09/16 13:20:58   Final result by Rad Results In Interface (02/09/16 13:20:58)   Narrative:   CLINICAL DATA: Headache, vomiting  EXAM: CT HEAD WITHOUT CONTRAST  TECHNIQUE: Contiguous axial images were obtained from the base of the skull through the vertex without intravenous contrast.  COMPARISON: MR brain 05/10/2010  FINDINGS: There is no evidence of mass effect, midline shift, or extra-axial fluid collections. There is no evidence of a space-occupying lesion or intracranial hemorrhage. There is no evidence of a cortical-based area of acute infarction.  The ventricles and sulci are appropriate for the patient's age. The basal cisterns are patent.  Visualized portions of the orbits are unremarkable. The visualized portions of the paranasal sinuses and mastoid air cells are unremarkable.  The osseous structures are unremarkable.  IMPRESSION: No acute intracranial pathology.   Electronically Signed By: Kathreen Devoid On: 02/09/2016 13:20       ____________________________________________   PROCEDURES  Procedure(s) performed: None  Critical Care performed: No  ____________________________________________   INITIAL IMPRESSION / ASSESSMENT AND PLAN / ED COURSE  Pertinent labs & imaging results that were available during my care of the patient were reviewed by me and considered in my medical decision making (see chart for details).  Patient was for evaluation of persistent headache for about the last 5 days. She is hemodynamically stable, afebrile in no distress. Her neurologic exam is intact, though she does have some mild photophobia. She doesn't history of previous headaches, and was prescribed Fioricet but states she was unable to afford  this by the primary.  I find no evidence to  suggest acute meningitis. The patient reports she has a history of headaches, they are similar but this seems to be slightly worse than normal. I discussed the patient's case care and CT scan with Dr. Doy Mince of neurology who advises against performing spinal tap given the patient's ongoing symptom pathology for several days and a history of previous headaches. There is no evidence of giant cell arteritis by exam the ESR is slightly elevated. I doubt this.  No evidence of acute aneurysmal rupture or acute infarction on CT. I will treat her with Depakote and IV Valium as advised by neurology. I did offer the patient lumbar puncture as I did tell her that we wish to exclude "infection" and "meningitis" which could be life-threatening or disabling. The patient stated after reviewing the risks and benefits and was shared medical decision making that she did not have any circumstances to have this performed, but rather we will treat her headache with pain medicine here. If her headaches improve to be comfortable going home and return to the ER right away if she develops any confusion, worsening symptoms, numbness tingling or weakness, fever or neck stiffness or other concerns arise. I think this is reasonable.  ----------------------------------------- 3:25 PM on 02/09/2016 -----------------------------------------  Patient reports her headache and symptoms are much improved. She is awake alert and denies ongoing photophobia. Reports feeling much better. We'll discharge her to home, headache return precautions discussed including very strict precautions for any confusion, numbness, tingling, fever stiffness or other concerns. Patient agreeable.  Discussed and prescribed prescription for tramadol. The patient is allergic to "Toradol" and clarified this with her. She does not drive, and does not take any other narcotics or benzodiazepines. No history of seizures.  Return precautions and treatment  recommendations and follow-up discussed with the patient who is agreeable with the plan.  ____________________________________________   FINAL CLINICAL IMPRESSION(S) / ED DIAGNOSES  Final diagnoses:  Nonintractable episodic headache, unspecified headache type      Delman Kitten, MD 02/09/16 1526

## 2016-02-09 NOTE — ED Notes (Signed)
Patient states that she has had a headache and has been vomiting since Monday. Per patient she is unable to keep anything down. Denies any sick contacts.

## 2016-07-31 ENCOUNTER — Inpatient Hospital Stay: Payer: Medicare Other | Attending: Obstetrics and Gynecology | Admitting: Obstetrics and Gynecology

## 2016-07-31 ENCOUNTER — Encounter: Payer: Self-pay | Admitting: Obstetrics and Gynecology

## 2016-07-31 VITALS — BP 141/81 | HR 92 | Temp 98.0°F | Ht 72.0 in | Wt 369.3 lb

## 2016-07-31 DIAGNOSIS — I25119 Atherosclerotic heart disease of native coronary artery with unspecified angina pectoris: Secondary | ICD-10-CM

## 2016-07-31 DIAGNOSIS — Z7984 Long term (current) use of oral hypoglycemic drugs: Secondary | ICD-10-CM | POA: Diagnosis not present

## 2016-07-31 DIAGNOSIS — F329 Major depressive disorder, single episode, unspecified: Secondary | ICD-10-CM

## 2016-07-31 DIAGNOSIS — E1165 Type 2 diabetes mellitus with hyperglycemia: Secondary | ICD-10-CM

## 2016-07-31 DIAGNOSIS — Z79899 Other long term (current) drug therapy: Secondary | ICD-10-CM

## 2016-07-31 DIAGNOSIS — I252 Old myocardial infarction: Secondary | ICD-10-CM | POA: Diagnosis not present

## 2016-07-31 DIAGNOSIS — Z79818 Long term (current) use of other agents affecting estrogen receptors and estrogen levels: Secondary | ICD-10-CM

## 2016-07-31 DIAGNOSIS — K219 Gastro-esophageal reflux disease without esophagitis: Secondary | ICD-10-CM | POA: Diagnosis not present

## 2016-07-31 DIAGNOSIS — G20 Parkinson's disease: Secondary | ICD-10-CM | POA: Diagnosis not present

## 2016-07-31 DIAGNOSIS — N8502 Endometrial intraepithelial neoplasia [EIN]: Secondary | ICD-10-CM | POA: Diagnosis not present

## 2016-07-31 DIAGNOSIS — Z7982 Long term (current) use of aspirin: Secondary | ICD-10-CM

## 2016-07-31 DIAGNOSIS — I10 Essential (primary) hypertension: Secondary | ICD-10-CM

## 2016-07-31 DIAGNOSIS — I4892 Unspecified atrial flutter: Secondary | ICD-10-CM

## 2016-07-31 DIAGNOSIS — Z6841 Body Mass Index (BMI) 40.0 and over, adult: Secondary | ICD-10-CM

## 2016-07-31 DIAGNOSIS — J45909 Unspecified asthma, uncomplicated: Secondary | ICD-10-CM | POA: Diagnosis not present

## 2016-07-31 DIAGNOSIS — G4733 Obstructive sleep apnea (adult) (pediatric): Secondary | ICD-10-CM

## 2016-07-31 DIAGNOSIS — E669 Obesity, unspecified: Secondary | ICD-10-CM

## 2016-07-31 NOTE — Progress Notes (Signed)
Patient here for follow up No complaints today. She has lost 9 pounds this month secondary to diet. She has had one period since last visit in February last 8 days.

## 2016-07-31 NOTE — Progress Notes (Signed)
Gynecologic Oncology Visit   Referring Provider: Burnett Corrente, MD  Chief Concern: EIN  Subjective:  Audrey Hughes is a 60 y.o. morbidly obese G7P3 female who was seen in consultation from Dr. Tildon Husky for EIN.  She saw Dr. Johnnette Litter on 11/15/2015 and management options were discussed. She opted for hormonal therapy with Megace 40 mg bid.She presents today for follow up. She states she is taking the medication. She had one episode of bleeding in February 2017 and nothing further since.   She has lost weight 398 pounds in 11/15/2015 and now 369 pounds.  Gynecologic Oncology History  Audrey Hughes is a pleasant morbidly obese G7P3 female who was seen in consultation from Dr. Tildon Husky for EIN.  Menopause in early 41s.  Prior benign endometrial bx in 2012 with polyp. SIS did not reveal any remaining polyp.  Endometrial bx 09/18/15 by Dr Tildon Husky showed complex endometrial hyperplasia with atypia (EIN).   Problem List: Patient Active Problem List   Diagnosis Date Noted  . 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 Chest pain 07/09/2015  . Angina at rest [facility]) 07/08/2015  . CAD (coronary artery disease) 07/08/2015  . Diabetes type 2, uncontrolled (HCC) 07/08/2015  . Asthma 07/08/2015  . HTN (hypertension) 07/08/2015  . Depression 07/08/2015  . Paroxysmal atrial flutter (HCC) 07/08/2015  . GERD (gastroesophageal reflux disease) 07/08/2015    Past Medical History: Past Medical History:  Diagnosis Date  . Asthma   . CAD (coronary artery disease)    s/p MI  . Depression   . Diabetes mellitus (HCC)   . GERD (gastroesophageal reflux disease)   . HTN (hypertension)   . OSA (obstructive sleep apnea)   . Parkinson disease (HCC)   . Paroxysmal atrial flutter (HCC)   . Tremor     Past Surgical History: Past Surgical History:  Procedure Laterality Date  . CESAREAN SECTION    . CHOLECYSTECTOMY    . JOINT REPLACEMENT     . LAPAROSCOPIC TUBAL LIGATION    . TONSILLECTOMY AND ADENOIDECTOMY      Past Gynecologic History:  History of Abnormal pap: no, no abnormalities History of STDs: The patient denies history of sexually transmitted disease. Contraception: no method Sexually active: no  OB History:  OB History  No data available    Family History: Family History  Problem Relation Age of Onset  . Diabetes Mother   . Heart disease Mother   . COPD Father   . Hypertension Father     Social History: Social History   Social History  . Marital status: Single    Spouse name: N/A  . Number of children: N/A  . Years of education: N/A   Occupational History  . Not on file.   Social History Main Topics  . Smoking status: Never Smoker  . Smokeless tobacco: Never Used  . Alcohol use No     Comment: 1 can of beer  . Drug use: No  . Sexual activity: Not on file   Other Topics Concern  . Not on file   Social History Narrative  . No narrative on file    Allergies: Allergies  Allergen Reactions  . Ketorolac Hives  . Diltiazem Hives  . Flagyl [Metronidazole] Hives  . Sulfa Antibiotics Hives  . Toradol [Ketorolac Tromethamine] Hives    Current Medications: Current Outpatient Prescriptions  Medication Sig Dispense Refill  . albuterol (PROVENTIL HFA;VENTOLIN HFA) 108 (90 BASE) MCG/ACT inhaler Inhale 2 puffs  into the lungs every 4 (four) hours as needed for wheezing or shortness of breath.    Marland Kitchen aspirin 81 MG tablet Take 81 mg by mouth daily.    . budesonide-formoterol (SYMBICORT) 160-4.5 MCG/ACT inhaler Inhale 2 puffs into the lungs 2 (two) times daily.    . fluconazole (DIFLUCAN) 150 MG tablet Take 150 mg by mouth.    . gabapentin (NEURONTIN) 300 MG capsule Take 300 mg by mouth 2 (two) times daily.    Marland Kitchen HYDROcodone-acetaminophen (NORCO) 10-325 MG per tablet Take 1 tablet by mouth 3 (three) times daily as needed for severe pain.    . indapamide (LOZOL) 2.5 MG tablet Take 2.5 mg by mouth 2  (two) times daily.    . megestrol (MEGACE) 40 MG tablet Take 1 tablet (40 mg total) by mouth 2 (two) times daily. 60 tablet 12  . metFORMIN (GLUCOPHAGE) 500 MG tablet Take by mouth 2 (two) times daily with a meal.    . nystatin cream (MYCOSTATIN) Apply topically.    . ondansetron (ZOFRAN ODT) 4 MG disintegrating tablet Take 1 tablet (4 mg total) by mouth every 6 (six) hours as needed for nausea or vomiting. 20 tablet 0  . pantoprazole (PROTONIX) 40 MG tablet Take 40 mg by mouth daily.    Marland Kitchen PARoxetine (PAXIL) 40 MG tablet Take 40 mg by mouth every morning.    . quinapril (ACCUPRIL) 10 MG tablet Take 10 mg by mouth 2 (two) times daily.    . rosuvastatin (CRESTOR) 10 MG tablet Take 10 mg by mouth daily.     No current facility-administered medications for this visit.     Review of Systems General: fatigue HEENT: negative for, change in hearing, pain, discharge, tinnitus, vertigo, voice changes, sore throat, neck masses Pulmonary: dyspnea and cough Cardiac: negative for, pain Gastrointestinal: diarrhea, negative for n/v/constipation/decreased appetite Genitourinary/Sexual: issues with bladder function Ob/Gyn: negative for, irregular bleeding, pain Musculoskeletal: swelling Hematology: No coagulation disorder, negative for, easy bruising, bleeding Neurologic/Psych: numbness/tingling, weakness; feeling sad  Objective:  Physical Examination:  BP (!) 141/81 (BP Location: Left Arm, Patient Position: Sitting)   Pulse 92   Temp 98 F (36.7 C) (Tympanic)   Ht 6' (1.829 m)   Wt (!) 369 lb 4.3 oz (167.5 kg)   BMI 50.08 kg/m    ECOG Performance Status: 1 - Symptomatic but completely ambulatory  General appearance: alert, cooperative and appears stated age HEENT:PERRLA, neck supple with midline trachea and thyroid without masses Abdomen: soft, non-tender, without masses or organomegaly and no hernias Extremities:bilateral dependent cyanosis and edema, chronic Skin exam - normal coloration  and turgor, no rashes, no suspicious skin lesions noted. Neurological exam reveals alert, oriented, normal speech, no focal findings or movement disorder noted.  Pelvic: exam chaperoned by nurse;  Vulva: normal appearing vulva with no masses, tenderness or lesions; Vagina: normal vagina; Adnexa: limited due to habitus, but no obvious masses; Uterus: limited due to habitus but not obviously enlarged; Cervix: anteverted, no gross lesion; Rectal: not indicated    Assessment:  Audrey Hughes is a 60 y.o. female diagnosed with post menopausal bleeding and EIN (complex with atypia) started on Megace therapy with excellent results.   Multiple medical problems including Morbid Obesity Body mass index is 50.08 kg/m. uncontrolled type 2 DM, CAD, COPD  Plan:   Problem List Items Addressed This Visit      Genitourinary   Endometrial intraepithelial neoplasia (EIN) - Primary    Other Visit Diagnoses    Obesity  Continue with Megace at 40 mg bid.  Suggested return to clinic in  6 months to see [place] and the goal will be to suppress all bleeding.  If this is the case, we do not need to repeat biopsy based on initial consultation. I congratulated her on her weight loss and recommended continued weight loss. If she is doing well in 6 months we can consider referring her to one of the local Ob/Gyn as Dr. Tildon Husky is now in practice at [facility].   Artelia Laroche, MD  CC:  Burnett Corrente, MD

## 2016-07-31 NOTE — Progress Notes (Signed)
  Oncology Nurse Navigator Documentation  Navigator Location: [facility] (07/31/16 1400)                                  Acuity: Level 2 (07/31/16 1400)         Time Spent with Patient: 30 (07/31/16 1400)   Chaperoned pelvic exam

## 2016-10-16 NOTE — Progress Notes (Signed)
  Oncology Nurse Navigator Documentation Presciption for Megace 40mg  twice a day sent to [facility]. Navigator Location: [facility] (10/16/16 1500)   )Navigator Encounter Type: Letter/Fax/Email (10/16/16 1500)                                                    Time Spent with Patient: 15 (10/16/16 1500)

## 2017-02-05 ENCOUNTER — Ambulatory Visit: Payer: Medicare Other

## 2017-02-12 ENCOUNTER — Inpatient Hospital Stay: Payer: Medicare Other

## 2017-02-12 NOTE — Progress Notes (Deleted)
Gynecologic Oncology Visit   Referring Provider: Burnett Corrente, MD  Chief Concern: EIN  Subjective:  Audrey Hughes is a 61 y.o. morbidly obese G7P3 female who was seen in consultation from Dr. Tildon Husky for EIN.  She saw Dr. Johnnette Litter on 11/15/2015 and management options were discussed. She opted for hormonal therapy with Megace 40 mg bid.She presents today for follow up. She states she is taking the medication. She had one episode of bleeding in February 2017 and nothing further since.   She has lost weight 398 pounds in 11/15/2015 and now 369 pounds.  Gynecologic Oncology History  Audrey Hughes is a pleasant morbidly obese G7P3 female who was seen in consultation from Dr. Tildon Husky for EIN.  Menopause in early 84s.  Prior benign endometrial bx in 2012 with polyp. SIS did not reveal any remaining polyp.  Endometrial bx 09/18/15 by Dr Tildon Husky showed complex endometrial hyperplasia with atypia (EIN). She opted for hormonal therapy with Megace 40 mg bid.  Problem List: Patient Active Problem List   Diagnosis Date Noted  . 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 Chest pain 07/09/2015  . Angina at rest [facility]) 07/08/2015  . CAD (coronary artery disease) 07/08/2015  . Diabetes type 2, uncontrolled (HCC) 07/08/2015  . Asthma 07/08/2015  . HTN (hypertension) 07/08/2015  . Depression 07/08/2015  . Paroxysmal atrial flutter (HCC) 07/08/2015  . GERD (gastroesophageal reflux disease) 07/08/2015    Past Medical History: Past Medical History:  Diagnosis Date  . Asthma   . CAD (coronary artery disease)    s/p MI  . Depression   . Diabetes mellitus (HCC)   . GERD (gastroesophageal reflux disease)   . HTN (hypertension)   . OSA (obstructive sleep apnea)   . Parkinson disease (HCC)   . Paroxysmal atrial flutter (HCC)   . Tremor     Past Surgical History: Past Surgical History:  Procedure Laterality Date  . CESAREAN  SECTION    . CHOLECYSTECTOMY    . JOINT REPLACEMENT    . LAPAROSCOPIC TUBAL LIGATION    . TONSILLECTOMY AND ADENOIDECTOMY      Past Gynecologic History:  History of Abnormal pap: no, no abnormalities History of STDs: The patient denies history of sexually transmitted disease. Contraception: no method Sexually active: no  OB History:  OB History  No data available    Family History: Family History  Problem Relation Age of Onset  . Diabetes Mother   . Heart disease Mother   . COPD Father   . Hypertension Father     Social History: Social History   Social History  . Marital status: Single    Spouse name: N/A  . Number of children: N/A  . Years of education: N/A   Occupational History  . Not on file.   Social History Main Topics  . Smoking status: Never Smoker  . Smokeless tobacco: Never Used  . Alcohol use No     Comment: 1 can of beer  . Drug use: No  . Sexual activity: Not on file   Other Topics Concern  . Not on file   Social History Narrative  . No narrative on file    Allergies: Allergies  Allergen Reactions  . Ketorolac Hives  . Diltiazem Hives  . Flagyl [Metronidazole] Hives  . Sulfa Antibiotics Hives  . Toradol [Ketorolac Tromethamine] Hives    Current Medications: Current Outpatient Prescriptions  Medication Sig Dispense Refill  . albuterol (PROVENTIL HFA;VENTOLIN  HFA) 108 (90 BASE) MCG/ACT inhaler Inhale 2 puffs into the lungs every 4 (four) hours as needed for wheezing or shortness of breath.    Marland Kitchen. aspirin 81 MG tablet Take 81 mg by mouth daily.    . budesonide-formoterol (SYMBICORT) 160-4.5 MCG/ACT inhaler Inhale 2 puffs into the lungs 2 (two) times daily.    . fluconazole (DIFLUCAN) 150 MG tablet Take 150 mg by mouth.    . gabapentin (NEURONTIN) 300 MG capsule Take 300 mg by mouth 2 (two) times daily.    Marland Kitchen. HYDROcodone-acetaminophen (NORCO) 10-325 MG per tablet Take 1 tablet by mouth 3 (three) times daily as needed for severe pain.    .  indapamide (LOZOL) 2.5 MG tablet Take 2.5 mg by mouth 2 (two) times daily.    . megestrol (MEGACE) 40 MG tablet Take 1 tablet (40 mg total) by mouth 2 (two) times daily. 60 tablet 12  . metFORMIN (GLUCOPHAGE) 500 MG tablet Take by mouth 2 (two) times daily with a meal.    . nystatin cream (MYCOSTATIN) Apply topically.    . ondansetron (ZOFRAN ODT) 4 MG disintegrating tablet Take 1 tablet (4 mg total) by mouth every 6 (six) hours as needed for nausea or vomiting. 20 tablet 0  . pantoprazole (PROTONIX) 40 MG tablet Take 40 mg by mouth daily.    Marland Kitchen. PARoxetine (PAXIL) 40 MG tablet Take 40 mg by mouth every morning.    . quinapril (ACCUPRIL) 10 MG tablet Take 10 mg by mouth 2 (two) times daily.    . rosuvastatin (CRESTOR) 10 MG tablet Take 10 mg by mouth daily.     No current facility-administered medications for this visit.     Review of Systems General: fatigue HEENT: negative for, change in hearing, pain, discharge, tinnitus, vertigo, voice changes, sore throat, neck masses Pulmonary: dyspnea and cough Cardiac: negative for, pain Gastrointestinal: diarrhea, negative for n/v/constipation/decreased appetite Genitourinary/Sexual: issues with bladder function Ob/Gyn: negative for, irregular bleeding, pain Musculoskeletal: swelling Hematology: No coagulation disorder, negative for, easy bruising, bleeding Neurologic/Psych: numbness/tingling, weakness; feeling sad  Objective:  Physical Examination:  There were no vitals taken for this visit.   ECOG Performance Status: 1 - Symptomatic but completely ambulatory  General appearance: alert, cooperative and appears stated age HEENT:PERRLA, neck supple with midline trachea and thyroid without masses Abdomen: soft, non-tender, without masses or organomegaly and no hernias Extremities:bilateral dependent cyanosis and edema, chronic Skin exam - normal coloration and turgor, no rashes, no suspicious skin lesions noted. Neurological exam reveals  alert, oriented, normal speech, no focal findings or movement disorder noted.  Pelvic: exam chaperoned by nurse;  Vulva: normal appearing vulva with no masses, tenderness or lesions; Vagina: normal vagina; Adnexa: limited due to habitus, but no obvious masses; Uterus: limited due to habitus but not obviously enlarged; Cervix: anteverted, no gross lesion; Rectal: not indicated    Assessment:  Audrey Hughes is a 61 y.o. female diagnosed with post menopausal bleeding and EIN (complex with atypia) started on Megace therapy with excellent results.   Multiple medical problems including Morbid Obesity There is no height or weight on file to calculate BMI. uncontrolled type 2 DM, CAD, COPD  Plan:   Problem List Items Addressed This Visit    None     Continue with Megace at 40 mg bid.  Suggested return to clinic in  6 months to see us and the goal will be to suppress all bleeding.  If this is the case, we do not need to  repeat biopsy based on initial consultation. I congratulated her on her weight loss and recommended continued weight loss. If she is doing well in 6 months we can consider referring her to one of the local Ob/Gyn as Dr. Tildon Husky is now in practice at [facility].   Artelia Laroche, MD  CC:  Burnett Corrente, MD

## 2017-03-05 ENCOUNTER — Inpatient Hospital Stay: Payer: Medicare Other | Attending: Obstetrics and Gynecology | Admitting: Obstetrics and Gynecology

## 2017-03-05 ENCOUNTER — Encounter: Payer: Self-pay | Admitting: Obstetrics and Gynecology

## 2017-03-05 VITALS — BP 142/78 | HR 110 | Temp 97.8°F | Resp 20 | Ht 72.0 in | Wt 349.2 lb

## 2017-03-05 DIAGNOSIS — F329 Major depressive disorder, single episode, unspecified: Secondary | ICD-10-CM | POA: Diagnosis not present

## 2017-03-05 DIAGNOSIS — I25119 Atherosclerotic heart disease of native coronary artery with unspecified angina pectoris: Secondary | ICD-10-CM | POA: Diagnosis not present

## 2017-03-05 DIAGNOSIS — I10 Essential (primary) hypertension: Secondary | ICD-10-CM

## 2017-03-05 DIAGNOSIS — Z79818 Long term (current) use of other agents affecting estrogen receptors and estrogen levels: Secondary | ICD-10-CM | POA: Diagnosis not present

## 2017-03-05 DIAGNOSIS — I252 Old myocardial infarction: Secondary | ICD-10-CM | POA: Insufficient documentation

## 2017-03-05 DIAGNOSIS — K219 Gastro-esophageal reflux disease without esophagitis: Secondary | ICD-10-CM | POA: Diagnosis not present

## 2017-03-05 DIAGNOSIS — G4733 Obstructive sleep apnea (adult) (pediatric): Secondary | ICD-10-CM | POA: Insufficient documentation

## 2017-03-05 DIAGNOSIS — N8502 Endometrial intraepithelial neoplasia [EIN]: Secondary | ICD-10-CM | POA: Insufficient documentation

## 2017-03-05 DIAGNOSIS — E1165 Type 2 diabetes mellitus with hyperglycemia: Secondary | ICD-10-CM | POA: Insufficient documentation

## 2017-03-05 DIAGNOSIS — Z7982 Long term (current) use of aspirin: Secondary | ICD-10-CM | POA: Insufficient documentation

## 2017-03-05 DIAGNOSIS — Z79899 Other long term (current) drug therapy: Secondary | ICD-10-CM

## 2017-03-05 DIAGNOSIS — I4892 Unspecified atrial flutter: Secondary | ICD-10-CM | POA: Insufficient documentation

## 2017-03-05 DIAGNOSIS — Z794 Long term (current) use of insulin: Secondary | ICD-10-CM | POA: Diagnosis not present

## 2017-03-05 DIAGNOSIS — Z780 Asymptomatic menopausal state: Secondary | ICD-10-CM | POA: Insufficient documentation

## 2017-03-05 DIAGNOSIS — G20 Parkinson's disease: Secondary | ICD-10-CM | POA: Diagnosis not present

## 2017-03-05 NOTE — Progress Notes (Signed)
Pt had some spotting for 2 days at end of feb. And it was dark.

## 2017-03-05 NOTE — Patient Instructions (Signed)
Megestrol tablets What is this medicine? MEGESTROL (me JES trol) belongs to a class of drugs known as progestins. Megestrol tablets are used to treat advanced breast or endometrial cancer. This medicine may be used for other purposes; ask your health care provider or pharmacist if you have questions. COMMON BRAND NAME(S): Megace What should I tell my health care provider before I take this medicine? They need to know if you have any of these conditions: -adrenal gland problems -history of blood clots of the legs, lungs, or other parts of the body -diabetes -kidney disease -liver disease -stroke -an unusual or allergic reaction to megestrol, other medicines, foods, dyes, or preservatives -pregnant or trying to get pregnant -breast-feeding How should I use this medicine? Take this medicine by mouth. Follow the directions on the prescription label. Do not take your medicine more often than directed. Take your doses at regular intervals. Do not stop taking except on the advice of your doctor or health care professional. Talk to your pediatrician regarding the use of this medicine in children. Special care may be needed. Overdosage: If you think you have taken too much of this medicine contact a poison control center or emergency room at once. NOTE: This medicine is only for you. Do not share this medicine with others. What if I miss a dose? If you miss a dose, take it as soon as you can. If it is almost time for your next dose, take only that dose. Do not take double or extra doses. What may interact with this medicine? Do not take this medicine with any of the following medications: -dofetilide This medicine may also interact with the following medications: -carbamazepine -indinavir -phenobarbital -phenytoin -primidone -rifampin -warfarin This list may not describe all possible interactions. Give your health care provider a list of all the medicines, herbs, non-prescription drugs, or  dietary supplements you use. Also tell them if you smoke, drink alcohol, or use illegal drugs. Some items may interact with your medicine. What should I watch for while using this medicine? Visit your doctor or health care professional for regular checks on your progress. Continue taking this medicine even if you feel better. It may take 2 months of regular use before you know if this medicine is working for your condition. If you are a female of child-bearing age, use an effective method of birth control while you are taking this medicine. This medicine should not be used by females who are pregnant or breast-feeding. There is a potential for serious side effects to an unborn child or to an infant. Talk to your health care professional or pharmacist for more information. If you have diabetes, this medicine may affect blood sugar levels. Check your blood sugar and talk to your doctor or health care professional if you notice changes. What side effects may I notice from receiving this medicine? Side effects that you should report to your doctor or health care professional as soon as possible: -difficulty breathing or shortness of breath -chest pain -dizziness -fluid retention -increased blood pressure -leg pain or swelling -nausea and vomiting -skin rash or itching -weakness Side effects that usually do not require medical attention (report to your doctor or health care professional if they continue or are bothersome): -breakthrough menstrual bleeding -hot flashes or flushing -increased appetite -mood changes -sweating -weight gain This list may not describe all possible side effects. Call your doctor for medical advice about side effects. You may report side effects to FDA at 1-800-FDA-1088. Where should I keep   my medicine? Keep out of the reach of children. Store at controlled room temperature between 15 and 30 degrees C (59 and 86 degrees F). Protect from heat above 40 degrees C (104  degrees F). Throw away any unused medicine after the expiration date. NOTE: This sheet is a summary. It may not cover all possible information. If you have questions about this medicine, talk to your doctor, pharmacist, or health care provider.  2018 Elsevier/Gold Standard (2008-07-04 15:57:10) Endometrial Biopsy, Care After This sheet gives you information about how to care for yourself after your procedure. Your health care provider may also give you more specific instructions. If you have problems or questions, contact your health care provider. What can I expect after the procedure? After the procedure, it is common to have:  Mild cramping.  A small amount of vaginal bleeding for a few days. This is normal. Follow these instructions at home:  Take over-the-counter and prescription medicines only as told by your health care provider.  Do not douche, use tampons, or have sexual intercourse until your health care provider approves.  Return to your normal activities as told by your health care provider. Ask your health care provider what activities are safe for you.  Follow instructions from your health care provider about any activity restrictions, such as restrictions on strenuous exercise or heavy lifting. Contact a health care provider if:  You have heavy bleeding, or bleed for longer than 2 days after the procedure.  You have bad smelling discharge from your vagina.  You have a fever or chills.  You have a burning sensation when urinating or you have difficulty urinating.  You have severe pain in your lower abdomen. Get help right away if:  You have severe cramps in your stomach or back.  You pass large blood clots.  Your bleeding increases.  You become weak or light-headed, or you pass out. Summary  After the procedure, it is common to have mild cramping and a small amount of vaginal bleeding for a few days.  Do not douche, use tampons, or have sexual intercourse  until your health care provider approves.  Return to your normal activities as told by your health care provider. Ask your health care provider what activities are safe for you. This information is not intended to replace advice given to you by your health care provider. Make sure you discuss any questions you have with your health care provider. Document Released: 10/06/2013 Document Revised: 01/01/2017 Document Reviewed: 01/01/2017 Elsevier Interactive Patient Education  2017 [employer]

## 2017-03-05 NOTE — Progress Notes (Signed)
  Oncology Nurse Navigator Documentation Chaperoned pelvic exam and EMB. EMB sent to pathology. Educated on EMB after care and provided written instructions regarding. Answered questions related to Megace. Dr. Johnnette LitterBerchuck will wait until biopsy returns before refilling Megace. She has appx 4 week supply left. Navigator Location: [facility] On[facility] (03/05/17 1400)   )Navigator Encounter Type: Follow-up Appt (03/05/17 1400)                     Patient Visit Type: GynOnc (03/05/17 1400)                              Time Spent with Patient: 30 (03/05/17 1400)

## 2017-03-05 NOTE — Progress Notes (Signed)
Gynecologic Oncology Visit   Referring Provider: Burnett CorrenteJohanna Halfon, MD  Chief Concern: EIN  Subjective:  Audrey Hughes is a 61 y.o. morbidly obese G7P3 female seen in consultation from Dr. Tildon HuskyHalfon for EIN.  She saw Dr. Johnnette LitterBerchuck on 11/15/2015 for EIN and management options were discussed. She opted for hormonal therapy with Megace 40 mg bid.   She has lost weight 398 pounds in 01/14/2015 to 369 pounds now down to 349.  Had two days of spotting last week, none since then. No other new complaints. She is mostly sedentary and not working.    Gynecologic Oncology History  Audrey Hughes is a pleasant morbidly obese G7P3 female who was seen in consultation from Dr. Tildon HuskyHalfon for EIN.  Menopause in early 450s.  Prior benign endometrial bx in 2012 with polyp. SIS did not reveal any remaining polyp.  Endometrial bx 09/18/15 by Dr Tildon HuskyHalfon showed complex endometrial hyperplasia with atypia (EIN). Options were discussed. She opted for hormonal therapy with Megace 40 mg bid.   Problem List: Patient Active Problem List   Diagnosis Date Noted  . 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 Chest pain 07/09/2015  . Angina at rest [facility](HCC) 07/08/2015  . CAD (coronary artery disease) 07/08/2015  . Diabetes type 2, uncontrolled (HCC) 07/08/2015  . Asthma 07/08/2015  . HTN (hypertension) 07/08/2015  . Depression 07/08/2015  . Paroxysmal atrial flutter (HCC) 07/08/2015  . GERD (gastroesophageal reflux disease) 07/08/2015    Past Medical History: Past Medical History:  Diagnosis Date  . Asthma   . CAD (coronary artery disease)    s/p MI  . Depression   . Diabetes mellitus (HCC)   . GERD (gastroesophageal reflux disease)   . HTN (hypertension)   . OSA (obstructive sleep apnea)   . Parkinson disease (HCC)   . Paroxysmal atrial flutter (HCC)   . Tremor   . VIN I (vulvar intraepithelial neoplasia I)     Past Surgical History: Past Surgical  History:  Procedure Laterality Date  . CESAREAN SECTION    . CHOLECYSTECTOMY    . JOINT REPLACEMENT    . LAPAROSCOPIC TUBAL LIGATION    . TONSILLECTOMY AND ADENOIDECTOMY      Past Gynecologic History:  History of Abnormal pap: no, no abnormalities History of STDs: The patient denies history of sexually transmitted disease. Contraception: no method Sexually active: no  Family History: Family History  Problem Relation Age of Onset  . Diabetes Mother   . Heart disease Mother   . COPD Father   . Hypertension Father     Social History: Social History   Social History  . Marital status: Single    Spouse name: N/A  . Number of children: N/A  . Years of education: N/A   Occupational History  . Not on file.   Social History Main Topics  . Smoking status: Never Smoker  . Smokeless tobacco: Never Used  . Alcohol use No     Comment: 1 can of beer  . Drug use: No  . Sexual activity: Not on file   Other Topics Concern  . Not on file   Social History Narrative  . No narrative on file    Allergies: Allergies  Allergen Reactions  . Ketorolac Hives  . Diltiazem Hives  . Flagyl [Metronidazole] Hives  . Sulfa Antibiotics Hives  . Toradol [Ketorolac Tromethamine] Hives    Current Medications: Current Outpatient Prescriptions  Medication Sig Dispense Refill  .  albuterol (PROVENTIL HFA;VENTOLIN HFA) 108 (90 BASE) MCG/ACT inhaler Inhale 2 puffs into the lungs every 4 (four) hours as needed for wheezing or shortness of breath.    Marland Kitchen aspirin 81 MG tablet Take 81 mg by mouth daily.    . fluconazole (DIFLUCAN) 150 MG tablet Take 150 mg by mouth.    . gabapentin (NEURONTIN) 300 MG capsule Take 300 mg by mouth 2 (two) times daily.    Marland Kitchen HYDROcodone-acetaminophen (NORCO) 10-325 MG per tablet Take 1 tablet by mouth 3 (three) times daily as needed for severe pain.    . indapamide (LOZOL) 2.5 MG tablet Take 2.5 mg by mouth 2 (two) times daily.    . insulin regular (NOVOLIN R,HUMULIN  R) 250 units/2.76mL (100 units/mL) injection Inject 80 Units into the skin 2 (two) times daily before a meal. In evening    . megestrol (MEGACE) 40 MG tablet Take 1 tablet (40 mg total) by mouth 2 (two) times daily. 60 tablet 12  . metFORMIN (GLUCOPHAGE) 500 MG tablet Take by mouth 2 (two) times daily with a meal.    . pantoprazole (PROTONIX) 40 MG tablet Take 40 mg by mouth daily.    Marland Kitchen PARoxetine (PAXIL) 40 MG tablet Take 40 mg by mouth every morning.    . quinapril (ACCUPRIL) 10 MG tablet Take 10 mg by mouth 2 (two) times daily.    . rosuvastatin (CRESTOR) 10 MG tablet Take 10 mg by mouth daily.    Marland Kitchen nystatin cream (MYCOSTATIN) Apply topically.     No current facility-administered medications for this visit.     Review of Systems General: fatigue HEENT: negative for, change in hearing, pain, discharge, tinnitus, vertigo, voice changes, sore throat, neck masses Pulmonary: dyspnea and cough Cardiac: negative for, pain Gastrointestinal: diarrhea, negative for n/v/constipation/decreased appetite Genitourinary/Sexual: issues with bladder function Ob/Gyn: negative for, irregular bleeding, pain Musculoskeletal: swelling Hematology: No coagulation disorder, negative for, easy bruising, bleeding Neurologic/Psych: numbness/tingling, weakness; feeling sad  Objective:  Physical Examination:  BP (!) 142/78   Pulse (!) 110   Temp 97.8 F (36.6 C) (Tympanic)   Resp 20   Ht 6' (1.829 m)   Wt (!) 349 lb 3.3 oz (158.4 kg)   BMI 47.36 kg/m    ECOG Performance Status: 1 - Symptomatic but completely ambulatory  General appearance: alert, cooperative and appears stated age HEENT:PERRLA, neck supple with midline trachea and thyroid without masses Abdomen: soft, non-tender, without masses or organomegaly and no hernias Extremities:bilateral dependent cyanosis and edema, chronic Skin exam - normal coloration and turgor, no rashes, no suspicious skin lesions noted. Neurological exam reveals alert,  oriented, normal speech, no focal findings or movement disorder noted.  Pelvic: exam chaperoned by nurse;  Vulva: normal appearing vulva with no masses, tenderness or lesions; Vagina: normal vagina, some dark blood in vault; Adnexa: limited due to habitus, but no obvious masses; Uterus: limited due to habitus but not obviously enlarged; Cervix: anteverted, no gross lesion; Rectal: normal.  Endometrial biopsy done after written informed consent and time out.  Cervix grasped with tenaculum. Cavity sounded to 10 cm and significant tissue obtained with Pipelle.  No bleeding after tenaculum removed.        Assessment:  Audrey Hughes is a 61 y.o. female diagnosed with post menopausal bleeding and EIN (complex with atypia) started on Megace therapy 11/16 with excellent control of bleeding, but new spotting last week.   Multiple medical problems including Morbid Obesity Body mass index is 47.36 kg/m. uncontrolled type  2 DM, CAD, COPD.  Plan:   Problem List Items Addressed This Visit      Genitourinary   EIN (endometrial intraepithelial neoplasia) - Primary     Will await endometrial biopsy results.  If persistent EIN or evolution to cancer there are several options.  1) continue with Megace and increase dose from 40 mg bid to 80 mg bid. 2) Place Mirena IUD.  3) Hysterectomy is an option, but she is morbidly obese and not a great surgical candidate due to medical co-morbidities noted above, although she has lost about 50 pounds. 4) radiation therapy if cancer present, although this would not be easy in view of morbid obesity.      Will discuss above options with the patient when biopsy results available.    Leida Lauth, MD  CC:  Burnett Corrente, MD

## 2017-03-06 LAB — SURGICAL PATHOLOGY

## 2017-03-07 ENCOUNTER — Telehealth: Payer: Self-pay

## 2017-03-07 ENCOUNTER — Other Ambulatory Visit: Payer: Self-pay

## 2017-03-07 MED ORDER — MEGESTROL ACETATE 40 MG PO TABS: 40.0000 mg | ORAL_TABLET | Freq: Two times a day (BID) | ORAL | 12 refills | Status: DC

## 2017-03-07 NOTE — Progress Notes (Signed)
  Oncology Nurse Navigator Documentation Megace refilled per Dr. Johnnette LitterBerchuck     )

## 2017-03-07 NOTE — Telephone Encounter (Signed)
  Oncology Nurse Navigator Documentation Notified Audrey Hughes of negative EMB. Per Dr. Johnnette LitterBerchuck continue with Megace 40mg  twice daily.  Navigator Location: [facility] (03/07/17 0900)   )Navigator Encounter Type: Telephone;Diagnostic Results (03/07/17 0900)                                                    Time Spent with Patient: 15 (03/07/17 0900)

## 2017-03-20 ENCOUNTER — Other Ambulatory Visit: Payer: Self-pay | Admitting: Family Medicine

## 2017-03-20 DIAGNOSIS — Z1231 Encounter for screening mammogram for malignant neoplasm of breast: Secondary | ICD-10-CM

## 2017-04-21 ENCOUNTER — Ambulatory Visit: Payer: Medicare Other

## 2017-09-10 ENCOUNTER — Inpatient Hospital Stay: Payer: Medicare Other

## 2017-10-08 ENCOUNTER — Ambulatory Visit
Admission: RE | Admit: 2017-10-08 | Discharge: 2017-10-08 | Disposition: A | Discharge status: Home / Self Care | Payer: Medicare Other | Source: Ambulatory Visit | Attending: Family Medicine | Admitting: Family Medicine

## 2017-10-08 DIAGNOSIS — Z1231 Encounter for screening mammogram for malignant neoplasm of breast: Secondary | ICD-10-CM | POA: Diagnosis present

## 2017-10-15 ENCOUNTER — Inpatient Hospital Stay: Payer: Medicare Other | Attending: Obstetrics and Gynecology | Admitting: Obstetrics and Gynecology

## 2017-10-15 VITALS — BP 141/92 | HR 81 | Temp 97.8°F | Ht 72.0 in | Wt 335.4 lb

## 2017-10-15 DIAGNOSIS — N8502 Endometrial intraepithelial neoplasia [EIN]: Secondary | ICD-10-CM | POA: Diagnosis present

## 2017-10-15 DIAGNOSIS — I10 Essential (primary) hypertension: Secondary | ICD-10-CM | POA: Diagnosis not present

## 2017-10-15 DIAGNOSIS — E1165 Type 2 diabetes mellitus with hyperglycemia: Secondary | ICD-10-CM | POA: Insufficient documentation

## 2017-10-15 DIAGNOSIS — Z79899 Other long term (current) drug therapy: Secondary | ICD-10-CM | POA: Insufficient documentation

## 2017-10-15 DIAGNOSIS — I252 Old myocardial infarction: Secondary | ICD-10-CM | POA: Diagnosis not present

## 2017-10-15 DIAGNOSIS — Z6841 Body Mass Index (BMI) 40.0 and over, adult: Secondary | ICD-10-CM | POA: Diagnosis not present

## 2017-10-15 DIAGNOSIS — K219 Gastro-esophageal reflux disease without esophagitis: Secondary | ICD-10-CM | POA: Diagnosis not present

## 2017-10-15 DIAGNOSIS — F329 Major depressive disorder, single episode, unspecified: Secondary | ICD-10-CM | POA: Diagnosis not present

## 2017-10-15 DIAGNOSIS — G4733 Obstructive sleep apnea (adult) (pediatric): Secondary | ICD-10-CM | POA: Insufficient documentation

## 2017-10-15 DIAGNOSIS — G20 Parkinson's disease: Secondary | ICD-10-CM | POA: Insufficient documentation

## 2017-10-15 DIAGNOSIS — Z7982 Long term (current) use of aspirin: Secondary | ICD-10-CM | POA: Diagnosis not present

## 2017-10-15 DIAGNOSIS — I25119 Atherosclerotic heart disease of native coronary artery with unspecified angina pectoris: Secondary | ICD-10-CM | POA: Insufficient documentation

## 2017-10-15 DIAGNOSIS — Z7984 Long term (current) use of oral hypoglycemic drugs: Secondary | ICD-10-CM | POA: Insufficient documentation

## 2017-10-15 DIAGNOSIS — I4892 Unspecified atrial flutter: Secondary | ICD-10-CM | POA: Insufficient documentation

## 2017-10-15 NOTE — Progress Notes (Signed)
Audrey Hughes Visit   Referring Provider: Burnett Corrente, MD  Chief Concern: EIN  Subjective:  Audrey Hughes is a 61 y.o. morbidly obese G7P3 female seen in consultation from Dr. Tildon Husky for EIN.    She saw Dr. Johnnette Litter on 11/15/2015 for EIN and management options were discussed. She opted for hormonal therapy with Megace 40 mg bid.   Endometrial bx 3/18 - ENDOMETRIAL TISSUE WITH PROGESTIN EFFECT.  - NEGATIVE FOR ENDOMETRIAL INTRAEPITHELIAL NEOPLASIA AND CARCINOMA.   She has lost weight 398 pounds in 01/14/2015 to 320 today with dieting.  Had one day of spotting  Since last visit. She is mostly sedentary and not working.    Audrey Hughes History  Audrey Hughes is a pleasant morbidly obese G7P3 female who was seen in consultation from Dr. Tildon Husky for EIN.  Menopause in early 80s.  Prior benign endometrial bx in 2012 with polyp. SIS did not reveal any remaining polyp.  Endometrial bx 09/18/15 by Dr Tildon Husky showed complex endometrial hyperplasia with atypia (EIN). Options were discussed. She opted for hormonal therapy with Megace 40 mg bid.   Problem List: Patient Active Problem List   Diagnosis Date Noted  . 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 Chest pain 07/09/2015  . Angina at rest [facility]) 07/08/2015  . CAD (coronary artery disease) 07/08/2015  . Diabetes type 2, uncontrolled (HCC) 07/08/2015  . Asthma 07/08/2015  . HTN (hypertension) 07/08/2015  . Depression 07/08/2015  . Paroxysmal atrial flutter (HCC) 07/08/2015  . GERD (gastroesophageal reflux disease) 07/08/2015    Past Medical History: Past Medical History:  Diagnosis Date  . Asthma   . CAD (coronary artery disease)    s/p MI  . Depression   . Diabetes mellitus (HCC)   . GERD (gastroesophageal reflux disease)   . HTN (hypertension)   . OSA (obstructive sleep apnea)   . Parkinson disease (HCC)   . Paroxysmal atrial flutter (HCC)    . Tremor   . VIN I (vulvar intraepithelial neoplasia I)     Past Surgical History: Past Surgical History:  Procedure Laterality Date  . CESAREAN SECTION    . CHOLECYSTECTOMY    . JOINT REPLACEMENT    . LAPAROSCOPIC TUBAL LIGATION    . TONSILLECTOMY AND ADENOIDECTOMY      Past Audrey History:  History of Abnormal pap: no, no abnormalities History of STDs: The patient denies history of sexually transmitted disease. Contraception: no method Sexually active: no  Family History: Family History  Problem Relation Age of Onset  . Diabetes Mother   . Heart disease Mother   . COPD Father   . Hypertension Father   . Breast cancer Neg Hx     Social History: Social History   Social History  . Marital status: Single    Spouse name: N/A  . Number of children: N/A  . Years of education: N/A   Occupational History  . Not on file.   Social History Main Topics  . Smoking status: Never Smoker  . Smokeless tobacco: Never Used  . Alcohol use No     Comment: 1 can of beer  . Drug use: No  . Sexual activity: Not on file   Other Topics Concern  . Not on file   Social History Narrative  . No narrative on file    Allergies: Allergies  Allergen Reactions  . Ketorolac Hives  . Diltiazem Hives  . Flagyl [Metronidazole] Hives  . Sulfa Antibiotics  Hives  . Toradol [Ketorolac Tromethamine] Hives    Current Medications: Current Outpatient Prescriptions  Medication Sig Dispense Refill  . albuterol (PROVENTIL HFA;VENTOLIN HFA) 108 (90 BASE) MCG/ACT inhaler Inhale 2 puffs into the lungs every 4 (four) hours as needed for wheezing or shortness of breath.    Marland Kitchen aspirin 81 MG tablet Take 81 mg by mouth daily.    . fluconazole (DIFLUCAN) 150 MG tablet Take 150 mg by mouth.    . gabapentin (NEURONTIN) 300 MG capsule Take 300 mg by mouth 2 (two) times daily.    Marland Kitchen HYDROcodone-acetaminophen (NORCO) 10-325 MG per tablet Take 1 tablet by mouth 3 (three) times daily as needed for  severe pain.    . indapamide (LOZOL) 2.5 MG tablet Take 2.5 mg by mouth 2 (two) times daily.    . insulin regular (NOVOLIN R,HUMULIN R) 250 units/2.66mL (100 units/mL) injection Inject 80 Units into the skin 2 (two) times daily before a meal. In evening    . megestrol (MEGACE) 40 MG tablet Take 1 tablet (40 mg total) by mouth 2 (two) times daily. 60 tablet 12  . metFORMIN (GLUCOPHAGE) 500 MG tablet Take by mouth 2 (two) times daily with a meal.    . nystatin cream (MYCOSTATIN) Apply topically.    . pantoprazole (PROTONIX) 40 MG tablet Take 40 mg by mouth daily.    Marland Kitchen PARoxetine (PAXIL) 40 MG tablet Take 40 mg by mouth every morning.    . quinapril (ACCUPRIL) 10 MG tablet Take 10 mg by mouth 2 (two) times daily.    . rosuvastatin (CRESTOR) 10 MG tablet Take 10 mg by mouth daily.     No current facility-administered medications for this visit.     Review of Systems General: fatigue HEENT: negative for, change in hearing, pain, discharge, tinnitus, vertigo, voice changes, sore throat, neck masses Pulmonary: dyspnea and cough Cardiac: negative for, pain Gastrointestinal: diarrhea, negative for n/v/constipation/decreased appetite Genitourinary/Sexual: issues with bladder function Ob/Gyn: negative for, irregular bleeding, pain Musculoskeletal: swelling Hematology: No coagulation disorder, negative for, easy bruising, bleeding Neurologic/Psych: numbness/tingling, weakness; feeling sad  Objective:  Physical Examination:  BP (!) 141/92 (BP Location: Right Wrist, Patient Position: Sitting)   Pulse 81   Temp 97.8 F (36.6 C) (Tympanic)   Ht 6' (1.829 m)   Wt (!) 335 lb 6.4 oz (152.1 kg)   BMI 45.49 kg/m    ECOG Performance Status: 1 - Symptomatic but completely ambulatory  General appearance: alert, cooperative and appears stated age HEENT:PERRLA, neck supple with midline trachea and thyroid without masses Abdomen: soft, non-tender, without masses or organomegaly and no  hernias Extremities:bilateral dependent cyanosis and edema, chronic Skin exam - normal coloration and turgor, no rashes, no suspicious skin lesions noted. Neurological exam reveals alert, oriented, normal speech, no focal findings or movement disorder noted.  Pelvic: exam chaperoned by nurse;  Vulva: normal appearing vulva with no masses, tenderness or lesions; Vagina: normal vagina, some dark blood in vault; Adnexa: limited due to habitus, but no obvious masses; Uterus: limited due to habitus but not obviously enlarged; Cervix: anteverted, no gross lesion; Rectal: normal.      Assessment:  Audrey Hughes is a 61 y.o. female diagnosed with post menopausal bleeding and EIN (complex with atypia) started on Megace therapy 11/16 with excellent control of bleeding. Negative biopsy 3/18.  Multiple medical problems including Morbid Obesity Body mass index is 45.49 kg/m. uncontrolled type 2 DM, CAD, COPD.  Plan:   Problem List Items Addressed This Visit  Genitourinary   EIN (endometrial intraepithelial neoplasia) - Primary     No evidence of EIN now.  If recurrent EIN or evolution to cancer there are several options.  1) continue with Megace and increase dose from 40 mg bid to 80 mg bid. 2) Place Mirena IUD.  3) Hysterectomy is an option, but she is morbidly obese and not a great surgical candidate due to medical co-morbidities noted above, although she has lost about 50 pounds. 4) radiation therapy if cancer present, although this would not be easy in view of morbid obesity.      Will discuss above options with the patient when biopsy results available.    Leida LauthAndrew Briyana Badman, MD

## 2017-10-15 NOTE — Progress Notes (Signed)
  Oncology Nurse Navigator Documentation Chaperoned pelvic exam. Follow up in 6 months  Navigator Location: [facility] (10/15/17 1300)   )Navigator Encounter Type: Follow-up Appt (10/15/17 1300)                     Patient Visit Type: GynOnc (10/15/17 1300)                              Time Spent with Patient: 15 (10/15/17 1300)

## 2017-10-15 NOTE — Progress Notes (Signed)
Patient states she has some spotting last week. Just one day.

## 2018-04-06 ENCOUNTER — Ambulatory Visit
Admission: RE | Admit: 2018-04-06 | Discharge: 2018-04-06 | Disposition: A | Discharge status: Home / Self Care | Payer: Medicare Other | Source: Ambulatory Visit | Attending: Nurse Practitioner | Admitting: Nurse Practitioner

## 2018-04-06 ENCOUNTER — Other Ambulatory Visit: Payer: Self-pay | Admitting: Nurse Practitioner

## 2018-04-06 DIAGNOSIS — M79605 Pain in left leg: Secondary | ICD-10-CM | POA: Diagnosis not present

## 2018-04-06 DIAGNOSIS — I4892 Unspecified atrial flutter: Secondary | ICD-10-CM | POA: Diagnosis present

## 2018-04-06 DIAGNOSIS — I82812 Embolism and thrombosis of superficial veins of left lower extremities: Secondary | ICD-10-CM | POA: Insufficient documentation

## 2018-04-15 ENCOUNTER — Ambulatory Visit: Payer: Medicare Other

## 2018-04-29 ENCOUNTER — Ambulatory Visit: Payer: Medicare Other

## 2018-05-12 ENCOUNTER — Other Ambulatory Visit: Payer: Self-pay

## 2018-05-12 ENCOUNTER — Emergency Department: Payer: Medicare Other

## 2018-05-12 ENCOUNTER — Encounter: Payer: Self-pay | Admitting: Emergency Medicine

## 2018-05-12 DIAGNOSIS — Z794 Long term (current) use of insulin: Secondary | ICD-10-CM | POA: Insufficient documentation

## 2018-05-12 DIAGNOSIS — I10 Essential (primary) hypertension: Secondary | ICD-10-CM | POA: Insufficient documentation

## 2018-05-12 DIAGNOSIS — J029 Acute pharyngitis, unspecified: Secondary | ICD-10-CM | POA: Insufficient documentation

## 2018-05-12 DIAGNOSIS — R05 Cough: Secondary | ICD-10-CM | POA: Diagnosis present

## 2018-05-12 DIAGNOSIS — J40 Bronchitis, not specified as acute or chronic: Secondary | ICD-10-CM | POA: Diagnosis not present

## 2018-05-12 DIAGNOSIS — E119 Type 2 diabetes mellitus without complications: Secondary | ICD-10-CM | POA: Diagnosis not present

## 2018-05-12 DIAGNOSIS — I2510 Atherosclerotic heart disease of native coronary artery without angina pectoris: Secondary | ICD-10-CM | POA: Insufficient documentation

## 2018-05-12 DIAGNOSIS — Z79899 Other long term (current) drug therapy: Secondary | ICD-10-CM | POA: Insufficient documentation

## 2018-05-12 DIAGNOSIS — G20 Parkinson's disease: Secondary | ICD-10-CM | POA: Insufficient documentation

## 2018-05-12 DIAGNOSIS — Z7982 Long term (current) use of aspirin: Secondary | ICD-10-CM | POA: Insufficient documentation

## 2018-05-12 DIAGNOSIS — J45909 Unspecified asthma, uncomplicated: Secondary | ICD-10-CM | POA: Insufficient documentation

## 2018-05-12 LAB — GROUP A STREP BY PCR: GROUP A STREP BY PCR: NOT DETECTED

## 2018-05-12 NOTE — ED Triage Notes (Signed)
Pt to triage via w/c with no distress noted; pt reports sore throat and prod cough yellow sputum, sinus drainage x 4 days

## 2018-05-13 ENCOUNTER — Emergency Department
Admission: EM | Admit: 2018-05-13 | Discharge: 2018-05-13 | Disposition: A | Discharge status: Home / Self Care | Payer: Medicare Other | Attending: Emergency Medicine | Admitting: Emergency Medicine

## 2018-05-13 DIAGNOSIS — R059 Cough, unspecified: Secondary | ICD-10-CM

## 2018-05-13 DIAGNOSIS — J40 Bronchitis, not specified as acute or chronic: Secondary | ICD-10-CM

## 2018-05-13 DIAGNOSIS — R05 Cough: Secondary | ICD-10-CM

## 2018-05-13 MED ORDER — PREDNISONE 20 MG PO TABS
40.0000 mg | ORAL_TABLET | Freq: Once | ORAL | Status: AC
  Administered 2018-05-13: 40 mg via ORAL
  Filled 2018-05-13: qty 2

## 2018-05-13 MED ORDER — DEXAMETHASONE SODIUM PHOSPHATE 10 MG/ML IJ SOLN
10.0000 mg | Freq: Once | INTRAMUSCULAR | Status: DC
  Filled 2018-05-13: qty 1

## 2018-05-13 MED ORDER — HYDROCOD POLST-CPM POLST ER 10-8 MG/5ML PO SUER: 5.0000 mL | Freq: Two times a day (BID) | ORAL | 0 refills | Status: DC

## 2018-05-13 MED ORDER — HYDROCOD POLST-CPM POLST ER 10-8 MG/5ML PO SUER
5.0000 mL | Freq: Once | ORAL | Status: AC
  Administered 2018-05-13: 5 mL via ORAL
  Filled 2018-05-13: qty 5

## 2018-05-13 MED ORDER — PREDNISONE 20 MG PO TABS: ORAL_TABLET | ORAL | 0 refills | Status: DC

## 2018-05-13 NOTE — ED Provider Notes (Signed)
[facility] Emergency Department Provider Note   ____________________________________________   First MD Initiated Contact with Patient 05/13/18 [phone]     (approximate)  I have reviewed the triage vital signs and the nursing notes.   HISTORY  Chief Complaint Cough and Sore Throat    HPI Audrey Hughes is a 62 y.o. female who presents to the ED from home with a chief complaint of sore throat, cough productive yellow sputum, wheezing and sinus drainage.  Patient reports a 4-day history of the above symptoms.  Denies associated fever, chills, chest pain, shortness of breath, abdominal pain, nausea or vomiting.  Denies recent travel or trauma.   Past Medical History:  Diagnosis Date  . Asthma   . CAD (coronary artery disease)    s/p MI  . Depression   . Diabetes mellitus (HCC)   . GERD (gastroesophageal reflux disease)   . HTN (hypertension)   . OSA (obstructive sleep apnea)   . Parkinson disease (HCC)   . Paroxysmal atrial flutter (HCC)   . Tremor   . VIN I (vulvar intraepithelial neoplasia I)     Patient Active Problem List   Diagnosis Date Noted  . 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 Chest pain 07/09/2015  . Angina at rest [facility]) 07/08/2015  . CAD (coronary artery disease) 07/08/2015  . Diabetes type 2, uncontrolled (HCC) 07/08/2015  . Asthma 07/08/2015  . HTN (hypertension) 07/08/2015  . Depression 07/08/2015  . Paroxysmal atrial flutter (HCC) 07/08/2015  . GERD (gastroesophageal reflux disease) 07/08/2015    Past Surgical History:  Procedure Laterality Date  . CESAREAN SECTION    . CHOLECYSTECTOMY    . JOINT REPLACEMENT    . LAPAROSCOPIC TUBAL LIGATION    . TONSILLECTOMY AND ADENOIDECTOMY      Prior to Admission medications   Medication Sig Start Date End Date Taking? Authorizing Provider  albuterol (PROVENTIL HFA;VENTOLIN HFA) 108 (90 BASE) MCG/ACT  inhaler Inhale 2 puffs into the lungs every 4 (four) hours as needed for wheezing or shortness of breath.    [provider]  aspirin 81 MG tablet Take 81 mg by mouth daily.    [provider]  fluconazole (DIFLUCAN) 150 MG tablet Take 150 mg by mouth.    [provider]  gabapentin (NEURONTIN) 300 MG capsule Take 300 mg by mouth 2 (two) times daily.    [provider]  HYDROcodone-acetaminophen (NORCO) 10-325 MG per tablet Take 1 tablet by mouth 3 (three) times daily as needed for severe pain.    [provider]  indapamide (LOZOL) 2.5 MG tablet Take 2.5 mg by mouth 2 (two) times daily.    [provider]  insulin regular (NOVOLIN R,HUMULIN R) 250 units/2.108mL (100 units/mL) injection Inject 80 Units into the skin 2 (two) times daily before a meal. In evening    [provider]  megestrol (MEGACE) 40 MG tablet Take 1 tablet (40 mg total) by mouth 2 (two) times daily. 03/07/17   Leida Lauth, MD  metFORMIN (GLUCOPHAGE) 500 MG tablet Take by mouth 2 (two) times daily with a meal.    [provider]  nystatin cream (MYCOSTATIN) Apply topically.    [provider]  pantoprazole (PROTONIX) 40 MG tablet Take 40 mg by mouth daily.    [provider]  PARoxetine (PAXIL) 40 MG tablet Take 40 mg by mouth every morning.    [provider]  quinapril (ACCUPRIL) 10  MG tablet Take 10 mg by mouth 2 (two) times daily.    [provider]  rosuvastatin (CRESTOR) 10 MG tablet Take 10 mg by mouth daily.    [provider]    Allergies Ketorolac; Diltiazem; Flagyl [metronidazole]; Sulfa antibiotics; and Toradol [ketorolac tromethamine]  Family History  Problem Relation Age of Onset  . Diabetes Mother   . Heart disease Mother   . COPD Father   . Hypertension Father   . Breast cancer Neg Hx     Social History Social History   Tobacco Use  . Smoking status: Never Smoker  . Smokeless  tobacco: Never Used  Substance Use Topics  . Alcohol use: No    Alcohol/week: 0.0 oz    Comment: 1 can of beer  . Drug use: No    Review of Systems  Constitutional: No fever/chills. Eyes: No visual changes. ENT: Positive for sore throat and sinus drainage. Cardiovascular: Denies chest pain. Respiratory: Positive for productive cough.  Denies shortness of breath. Gastrointestinal: No abdominal pain.  No nausea, no vomiting.  No diarrhea.  No constipation. Genitourinary: Negative for dysuria. Musculoskeletal: Negative for back pain. Skin: Negative for rash. Neurological: Negative for headaches, focal weakness or numbness.   ____________________________________________   PHYSICAL EXAM:  VITAL SIGNS: ED Triage Vitals  Enc Vitals Group     BP 05/12/18 2316 (!) 159/73     Pulse Rate 05/12/18 2316 82     Resp 05/12/18 2316 18     Temp 05/12/18 2316 97.7 F (36.5 C)     Temp Source 05/12/18 2316 Oral     SpO2 05/12/18 2316 97 %     Weight 05/12/18 2315 (!) 350 lb (158.8 kg)     Height 05/12/18 2315 6' (1.829 m)     Head Circumference --      Peak Flow --      Pain Score 05/12/18 2315 8     Pain Loc --      Pain Edu? --      Excl. in GC? --     Constitutional: Alert and oriented. Well appearing and in no acute distress. Eyes: Conjunctivae are normal. PERRL. EOMI. Head: Atraumatic. Nose: No congestion/rhinnorhea. Mouth/Throat: Mucous membranes are moist.  Oropharynx mildly erythematous without tonsillar swelling, exudates or peritonsillar abscess.  There is no hoarse or muffled voice.  There is no drooling. Neck: No stridor.  Supple neck without meningismus. Cardiovascular: Normal rate, regular rhythm. Grossly normal heart sounds.  Good peripheral circulation. Respiratory: Normal respiratory effort.  No retractions. Lungs with slight wheeze which clears with breathing. Gastrointestinal: Obese.  Soft and nontender. No distention. No abdominal bruits. No CVA  tenderness. Musculoskeletal: No lower extremity tenderness.  Baseline 2+ nonpitting BLE edema.  No joint effusions. Neurologic:  Normal speech and language. No gross focal neurologic deficits are appreciated.  Skin:  Skin is warm, dry and intact. No rash noted. Psychiatric: Mood and affect are normal. Speech and behavior are normal.  ____________________________________________   LABS (all labs ordered are listed, but only abnormal results are displayed)  Labs Reviewed  GROUP A STREP BY PCR   ____________________________________________  EKG  None ____________________________________________  RADIOLOGY  ED MD interpretation: No pneumonia  Official radiology report(s): Dg Chest 2 View  Result Date: 05/12/2018 CLINICAL DATA:  Cough and sore throat EXAM: CHEST - 2 VIEW COMPARISON:  07/08/2015 FINDINGS: Diffuse interstitial opacity. No acute consolidation or effusion. Cardiomediastinal silhouette within normal limits. No pneumothorax. IMPRESSION: Diffuse increased interstitial opacity suggesting  interstitial inflammation/possible bronchitis. No focal pulmonary infiltrate. Electronically Signed   By: Jasmine Pang M.D.   On: 05/12/2018 23:40    ____________________________________________   PROCEDURES  Procedure(s) performed: None  Procedures  Critical Care performed: No  ____________________________________________   INITIAL IMPRESSION / ASSESSMENT AND PLAN / ED COURSE  As part of my medical decision making, I reviewed the following data within the electronic MEDICAL RECORD NUMBER History obtained from family, Nursing notes reviewed and incorporated, Old chart reviewed, Radiograph reviewed and Notes from prior ED visits   62 year old female who presents with bronchitis type symptoms for the past 4 days.  Rapid strep and chest x-ray negative.  Patient is a diabetic and states her baseline blood sugars are between 200-400.  For this reason, will place her on a lower dose  prednisone burst.  Will discharge home on Tussionex, and patient will follow-up with her PCP.  She is encouraged to use her albuterol nebulizer inhaler as needed.  Strict return precautions given.  Patient and family member verbalized understanding and agree with plan of care.      ____________________________________________   FINAL CLINICAL IMPRESSION(S) / ED DIAGNOSES  Final diagnoses:  Bronchitis  Cough     ED Discharge Orders    None       Note:  This document was prepared using Dragon voice recognition software and may include unintentional dictation errors.    Irean Hong, MD 05/13/18 [phone]

## 2018-05-13 NOTE — Discharge Instructions (Addendum)
1.  You may take cough medicine as needed (Tussionex). 2.  Continue albuterol inhaler and/or nebulizer every 4 hours as needed for cough/wheezing/difficulty breathing. 3.  Take prednisone 40 mg daily for the next 4 days.  Start your next dose Thursday morning. 4.  Return to the ER for worsening symptoms, persistent vomiting, difficulty breathing or other concerns.

## 2018-05-20 ENCOUNTER — Inpatient Hospital Stay: Payer: Medicare Other | Attending: Obstetrics and Gynecology | Admitting: Obstetrics and Gynecology

## 2018-05-20 VITALS — BP 127/85 | HR 98 | Temp 98.6°F | Resp 18 | Ht 72.0 in | Wt 353.4 lb

## 2018-05-20 DIAGNOSIS — Z79818 Long term (current) use of other agents affecting estrogen receptors and estrogen levels: Secondary | ICD-10-CM | POA: Insufficient documentation

## 2018-05-20 DIAGNOSIS — Z79811 Long term (current) use of aromatase inhibitors: Secondary | ICD-10-CM | POA: Diagnosis not present

## 2018-05-20 DIAGNOSIS — N8502 Endometrial intraepithelial neoplasia [EIN]: Secondary | ICD-10-CM

## 2018-05-20 NOTE — Progress Notes (Signed)
Gynecologic Oncology Interval Visit   Referring Provider: Burnett Corrente, MD  Chief Concern: EIN  Subjective:  Audrey Hughes is a 62 y.o. 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  Today, she reports that she was diagnosed with a blood clot in her left leg approximately 6 weeks ago and was started on warfarin. She reports she has not had any vaginal bleeding until 2 days ago when she started bleeding again. She continues to struggle with her weight but is overall down from initial meeting of 398lbs.   Also was in the ED recently with bronchitis and treated with steroids.   Exam on 10/15/17 revealed no evidence of EIN. We discussed if recurrent EIN or evolution to cancer we could consider continuation of Megace, dose changes, surgery, or radiation. She opted to continue megace  BID.   Gynecologic Oncology History  Audrey Hughes is a pleasant morbidly obese G7P3 female who was seen in consultation from Dr. Tildon Husky for EIN.  Menopause in early 72s.  Prior benign endometrial bx in 2012 with polyp. SIS did not reveal any remaining polyp.  Endometrial bx 09/18/15 by Dr Tildon Husky showed complex endometrial hyperplasia with atypia (EIN). Options were discussed. She opted for hormonal therapy with Megace 40 mg bid.   She saw Dr. Johnnette Litter on 11/15/2015 for EIN and management options were discussed. She opted for hormonal therapy with Megace 40 mg bid and had excellent control of bleeding.   Endometrial bx 3/18 - ENDOMETRIAL TISSUE WITH PROGESTIN EFFECT.  - NEGATIVE FOR ENDOMETRIAL INTRAEPITHELIAL NEOPLASIA AND CARCINOMA.    Problem List: Patient Active Problem List   Diagnosis Date Noted  . 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 Chest pain 07/09/2015  . Angina at rest [facility]) 07/08/2015  . CAD (coronary artery disease) 07/08/2015  . Diabetes type 2, uncontrolled (HCC)  07/08/2015  . Asthma 07/08/2015  . HTN (hypertension) 07/08/2015  . Depression 07/08/2015  . Paroxysmal atrial flutter (HCC) 07/08/2015  . GERD (gastroesophageal reflux disease) 07/08/2015    Past Medical History: Past Medical History:  Diagnosis Date  . Asthma   . CAD (coronary artery disease)    s/p MI  . Depression   . Diabetes mellitus (HCC)   . GERD (gastroesophageal reflux disease)   . HTN (hypertension)   . OSA (obstructive sleep apnea)   . Parkinson disease (HCC)   . Paroxysmal atrial flutter (HCC)   . Tremor   . VIN I (vulvar intraepithelial neoplasia I)     Past Surgical History: Past Surgical History:  Procedure Laterality Date  . CESAREAN SECTION    . CHOLECYSTECTOMY    . JOINT REPLACEMENT    . LAPAROSCOPIC TUBAL LIGATION    . TONSILLECTOMY AND ADENOIDECTOMY      Past Gynecologic History:  History of Abnormal pap: no, no abnormalities History of STDs: The patient denies history of sexually transmitted disease. Contraception: no method Sexually active: no  Family History: Family History  Problem Relation Age of Onset  . Diabetes Mother   . Heart disease Mother   . COPD Father   . Hypertension Father   . Breast cancer Neg Hx     Social History: Social History   Socioeconomic History  . Marital status: Single    Spouse name: Not on file  . Number of children: Not on file  . Years of education: Not on file  . Highest education level: Not  on file  Occupational History  . Not on file  Social Needs  . Financial resource strain: Not on file  . Food insecurity:    Worry: Not on file    Inability: Not on file  . Transportation needs:    Medical: Not on file    Non-medical: Not on file  Tobacco Use  . Smoking status: Never Smoker  . Smokeless tobacco: Never Used  Substance and Sexual Activity  . Alcohol use: No    Alcohol/week: 0.0 oz    Comment: 1 can of beer  . Drug use: No  . Sexual activity: Not on file  Lifestyle  . Physical  activity:    Days per week: Not on file    Minutes per session: Not on file  . Stress: Not on file  Relationships  . Social connections:    Talks on phone: Not on file    Gets together: Not on file    Attends religious service: Not on file    Active member of club or organization: Not on file    Attends meetings of clubs or organizations: Not on file    Relationship status: Not on file  . Intimate partner violence:    Fear of current or ex partner: Not on file    Emotionally abused: Not on file    Physically abused: Not on file    Forced sexual activity: Not on file  Other Topics Concern  . Not on file  Social History Narrative  . Not on file    Allergies: Allergies  Allergen Reactions  . Ketorolac Hives  . Diltiazem Hives  . Flagyl [Metronidazole] Hives  . Sulfa Antibiotics Hives  . Toradol [Ketorolac Tromethamine] Hives    Current Medications: Current Outpatient Medications  Medication Sig Dispense Refill  . albuterol (PROVENTIL HFA;VENTOLIN HFA) 108 (90 BASE) MCG/ACT inhaler Inhale 2 puffs into the lungs every 4 (four) hours as needed for wheezing or shortness of breath.    Marland Kitchen aspirin 81 MG tablet Take 81 mg by mouth daily.    . fluconazole (DIFLUCAN) 150 MG tablet Take 150 mg by mouth.    . gabapentin (NEURONTIN) 300 MG capsule Take 300 mg by mouth 2 (two) times daily.    Marland Kitchen HYDROcodone-acetaminophen (NORCO) 10-325 MG per tablet Take 1 tablet by mouth 3 (three) times daily as needed for severe pain.    . indapamide (LOZOL) 2.5 MG tablet Take 2.5 mg by mouth 2 (two) times daily.    . insulin regular (NOVOLIN R,HUMULIN R) 250 units/2.64mL (100 units/mL) injection Inject 80 Units into the skin 2 (two) times daily before a meal. In evening    . megestrol (MEGACE) 40 MG tablet Take 1 tablet (40 mg total) by mouth 2 (two) times daily. 60 tablet 12  . metFORMIN (GLUCOPHAGE) 500 MG tablet Take by mouth 2 (two) times daily with a meal.    . pantoprazole (PROTONIX) 40 MG tablet  Take 40 mg by mouth daily.    Marland Kitchen PARoxetine (PAXIL) 40 MG tablet Take 40 mg by mouth every morning.    . quinapril (ACCUPRIL) 10 MG tablet Take 10 mg by mouth 2 (two) times daily.    . rosuvastatin (CRESTOR) 10 MG tablet Take 10 mg by mouth daily.    Marland Kitchen warfarin (COUMADIN) 5 MG tablet Take 5 mg by mouth daily.    . chlorpheniramine-HYDROcodone (TUSSIONEX PENNKINETIC ER) 10-8 MG/5ML SUER Take 5 mLs by mouth 2 (two) times daily. (Patient not taking: Reported on  05/20/2018) 70 mL 0  . nystatin cream (MYCOSTATIN) Apply topically.    . predniSONE (DELTASONE) 20 MG tablet 2 tablets daily x 4 days (Patient not taking: Reported on 05/20/2018) 8 tablet 0   No current facility-administered medications for this visit.     Review of Systems General: fatigue, weakness HEENT: negative for, change in hearing, pain, discharge, tinnitus, vertigo, voice changes, sore throat, neck masses Pulmonary: dyspnea  Cardiac: negative for, pain Gastrointestinal: negative for abdominal pain, constipation and diarrhea, nausea, Genitourinary/Sexual: issues with bladder function Ob/Gyn: positive for irregular bleeding. Negative for pain Musculoskeletal: negative for, pain, swelling Hematology: No coagulation disorder, negative for, easy bruising, bleeding Neurologic/Psych: weakness  Objective:  Physical Examination:  BP 127/85 (BP Location: Left Arm, Patient Position: Sitting)   Pulse 98   Temp 98.6 F (37 C) (Tympanic)   Resp 18   Ht 6' (1.829 m)   Wt (!) 353 lb 6.4 oz (160.3 kg)   SpO2 96%   BMI 47.93 kg/m    ECOG Performance Status: 1 - Symptomatic but completely ambulatory  General appearance: alert, cooperative and appears stated age. Obese. HEENT:PERRLA, neck supple with midline trachea and thyroid without masses Abdomen: soft, non-tender, without masses or organomegaly and no hernias Extremities: bilateral dependent cyanosis and edema, chronic.  Some edema L>R. Skin exam - normal coloration and turgor,  no rashes, no suspicious skin lesions noted. Neurological exam reveals alert, oriented, normal speech, no focal findings or movement disorder noted.  Pelvic: exam chaperoned by NP;  Vulva: normal appearing vulva with no masses, tenderness or lesions; Vagina: normal vagina, some dark blood in vault; Adnexa: limited due to habitus, but no obvious masses; Uterus: limited due to habitus but not obviously enlarged; Cervix: anteverted, no gross lesion; Rectal: normal.  After informed consent obtained I did an endometrial biopsy with a Pipelle.  No tenaculum was needed.  Moderate amount of tissue obtained and the uterus sounded to about 8 cm.       Assessment:  Audrey Hughes is a 62 y.o. female diagnosed with post menopausal bleeding and EIN (complex with atypia) started on Megace therapy 11/16 with excellent control of bleeding. Negative biopsy 3/18.  Recently started coumadin for LE DVT and now has bleeding again so endometrial biopsy done.  Multiple medical problems including Morbid Obesity Body mass index is 47.93 kg/m. uncontrolled type 2 DM, CAD, COPD.  Plan:   Problem List Items Addressed This Visit      Genitourinary   EIN (endometrial intraepithelial neoplasia) - Primary     Will await endometrial biopsy results.   If recurrent EIN or evolution to cancer there are several options.  1) continue with Megace and increase dose from 40 mg bid to 80 mg bid. Would not favor this approach though in view of recent DVT.  2) Place Mirena IUD as this would reduce hypercoaguability and VTE risk.  3) Hysterectomy is an option, but she is morbidly obese and not a great surgical candidate due to medical co-morbidities noted above, although she has lost about 50 pounds. 4) radiation therapy if cancer present, although this would not be easy in view of morbid obesity.    Will discuss above options with the patient when biopsy results available.    Consuello Masse, DNP, AGNP-C [facility] [phone] (work cell) [phone] (office)   I personally interviewed and examined the patient. Agreed with the above/below plan of care. Patient/family questions were answered.  Leida Lauth, MD  CC:

## 2018-05-20 NOTE — Progress Notes (Signed)
Patient c/o fatigue, feeling tired, weakness, SOB, and vaginal bleeding x 2 days no pain noted.

## 2018-05-21 ENCOUNTER — Other Ambulatory Visit: Payer: Self-pay | Admitting: Nurse Practitioner

## 2018-05-21 DIAGNOSIS — N8502 Endometrial intraepithelial neoplasia [EIN]: Secondary | ICD-10-CM

## 2018-05-22 ENCOUNTER — Telehealth: Payer: Self-pay | Admitting: Nurse Practitioner

## 2018-05-22 LAB — SURGICAL PATHOLOGY

## 2018-05-22 NOTE — Telephone Encounter (Signed)
Called patient to discuss results from endometrial biopsy which was negative for malignancy or atypia.   Shared results with Dr. Johnnette Litter who recommends proceeding with IUD to help control bleeding. Would like to get her off megace in light of recent DVT. Patient does not have GYN and is open to referral. Will reach out to [facility] to inquire about placement of IUD.   Left message for patient to return call to discuss.

## 2018-06-17 ENCOUNTER — Encounter: Payer: Self-pay | Admitting: *Deleted

## 2018-06-17 NOTE — Anesthesia Pain Management Evaluation Note (Signed)
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

## 2018-06-25 ENCOUNTER — Ambulatory Visit: Payer: Medicare Other | Admitting: Certified Registered"

## 2018-06-25 ENCOUNTER — Ambulatory Visit
Admission: RE | Admit: 2018-06-25 | Discharge: 2018-06-25 | Disposition: A | Discharge status: Home / Self Care | Payer: Medicare Other | Source: Ambulatory Visit | Attending: Ophthalmology | Admitting: Ophthalmology

## 2018-06-25 ENCOUNTER — Encounter
Admission: RE | Disposition: A | Discharge status: Home / Self Care | Payer: Self-pay | Source: Ambulatory Visit | Attending: Ophthalmology

## 2018-06-25 DIAGNOSIS — G252 Other specified forms of tremor: Secondary | ICD-10-CM | POA: Insufficient documentation

## 2018-06-25 DIAGNOSIS — K219 Gastro-esophageal reflux disease without esophagitis: Secondary | ICD-10-CM | POA: Insufficient documentation

## 2018-06-25 DIAGNOSIS — I2510 Atherosclerotic heart disease of native coronary artery without angina pectoris: Secondary | ICD-10-CM | POA: Insufficient documentation

## 2018-06-25 DIAGNOSIS — G244 Idiopathic orofacial dystonia: Secondary | ICD-10-CM | POA: Diagnosis not present

## 2018-06-25 DIAGNOSIS — Z9981 Dependence on supplemental oxygen: Secondary | ICD-10-CM | POA: Insufficient documentation

## 2018-06-25 DIAGNOSIS — J449 Chronic obstructive pulmonary disease, unspecified: Secondary | ICD-10-CM | POA: Insufficient documentation

## 2018-06-25 DIAGNOSIS — I252 Old myocardial infarction: Secondary | ICD-10-CM | POA: Insufficient documentation

## 2018-06-25 DIAGNOSIS — Z79899 Other long term (current) drug therapy: Secondary | ICD-10-CM | POA: Insufficient documentation

## 2018-06-25 DIAGNOSIS — E1136 Type 2 diabetes mellitus with diabetic cataract: Secondary | ICD-10-CM | POA: Insufficient documentation

## 2018-06-25 DIAGNOSIS — H2511 Age-related nuclear cataract, right eye: Secondary | ICD-10-CM | POA: Diagnosis not present

## 2018-06-25 DIAGNOSIS — Z882 Allergy status to sulfonamides status: Secondary | ICD-10-CM | POA: Diagnosis not present

## 2018-06-25 DIAGNOSIS — Z794 Long term (current) use of insulin: Secondary | ICD-10-CM | POA: Insufficient documentation

## 2018-06-25 DIAGNOSIS — I4892 Unspecified atrial flutter: Secondary | ICD-10-CM | POA: Insufficient documentation

## 2018-06-25 DIAGNOSIS — E662 Morbid (severe) obesity with alveolar hypoventilation: Secondary | ICD-10-CM | POA: Diagnosis not present

## 2018-06-25 DIAGNOSIS — M1990 Unspecified osteoarthritis, unspecified site: Secondary | ICD-10-CM | POA: Insufficient documentation

## 2018-06-25 DIAGNOSIS — Z86718 Personal history of other venous thrombosis and embolism: Secondary | ICD-10-CM | POA: Diagnosis not present

## 2018-06-25 DIAGNOSIS — G20 Parkinson's disease: Secondary | ICD-10-CM | POA: Diagnosis not present

## 2018-06-25 DIAGNOSIS — E1142 Type 2 diabetes mellitus with diabetic polyneuropathy: Secondary | ICD-10-CM | POA: Diagnosis not present

## 2018-06-25 DIAGNOSIS — Z885 Allergy status to narcotic agent status: Secondary | ICD-10-CM | POA: Diagnosis not present

## 2018-06-25 DIAGNOSIS — I10 Essential (primary) hypertension: Secondary | ICD-10-CM | POA: Insufficient documentation

## 2018-06-25 DIAGNOSIS — Z888 Allergy status to other drugs, medicaments and biological substances status: Secondary | ICD-10-CM | POA: Diagnosis not present

## 2018-06-25 DIAGNOSIS — Z6841 Body Mass Index (BMI) 40.0 and over, adult: Secondary | ICD-10-CM | POA: Diagnosis not present

## 2018-06-25 HISTORY — DX: Cardiac arrhythmia, unspecified: I49.9

## 2018-06-25 HISTORY — DX: Acute embolism and thrombosis of unspecified deep veins of unspecified lower extremity: I82.409

## 2018-06-25 HISTORY — DX: Personal history of urinary calculi: Z87.442

## 2018-06-25 HISTORY — DX: Headache, unspecified: R51.9

## 2018-06-25 HISTORY — DX: Edema, unspecified: R60.9

## 2018-06-25 HISTORY — DX: Dyspnea, unspecified: R06.00

## 2018-06-25 HISTORY — DX: Personal history of other specified conditions: Z87.898

## 2018-06-25 HISTORY — DX: Unspecified osteoarthritis, unspecified site: M19.90

## 2018-06-25 HISTORY — DX: Other reduced mobility: Z74.09

## 2018-06-25 HISTORY — DX: Acute myocardial infarction, unspecified: I21.9

## 2018-06-25 HISTORY — DX: Idiopathic orofacial dystonia: G24.4

## 2018-06-25 HISTORY — DX: Obesity, unspecified: E66.9

## 2018-06-25 HISTORY — DX: Hypoxemia: R09.02

## 2018-06-25 HISTORY — DX: Polyneuropathy, unspecified: G62.9

## 2018-06-25 HISTORY — DX: Chronic obstructive pulmonary disease, unspecified: J44.9

## 2018-06-25 HISTORY — PX: CATARACT EXTRACTION W/PHACO: SHX586

## 2018-06-25 HISTORY — DX: Headache: R51

## 2018-06-25 LAB — GLUCOSE, CAPILLARY: Glucose-Capillary: 263 mg/dL — ABNORMAL HIGH (ref 70–99)

## 2018-06-25 SURGERY — PHACOEMULSIFICATION, CATARACT, WITH IOL INSERTION
Anesthesia: Monitor Anesthesia Care | Site: Eye | Laterality: Right | Wound class: "Clean "

## 2018-06-25 MED ORDER — ARMC OPHTHALMIC DILATING DROPS
1.0000 "application " | OPHTHALMIC | Status: AC
  Administered 2018-06-25 (×3): 1 via OPHTHALMIC

## 2018-06-25 MED ORDER — MIDAZOLAM HCL 2 MG/2ML IJ SOLN
INTRAMUSCULAR | Status: AC
  Filled 2018-06-25: qty 2

## 2018-06-25 MED ORDER — LIDOCAINE HCL (PF) 4 % IJ SOLN
INTRAOCULAR | Status: DC | PRN
  Administered 2018-06-25: 4 mL via OPHTHALMIC

## 2018-06-25 MED ORDER — MIDAZOLAM HCL 2 MG/2ML IJ SOLN
INTRAMUSCULAR | Status: DC | PRN
Start: 2018-06-25 — End: 2018-06-25
  Administered 2018-06-25: 2 mg via INTRAVENOUS

## 2018-06-25 MED ORDER — ARMC OPHTHALMIC DILATING DROPS
OPHTHALMIC | Status: AC
  Administered 2018-06-25: 1 via OPHTHALMIC
  Filled 2018-06-25: qty 0.4

## 2018-06-25 MED ORDER — SODIUM HYALURONATE 23 MG/ML IO SOLN
INTRAOCULAR | Status: AC
  Filled 2018-06-25: qty 0.6

## 2018-06-25 MED ORDER — EPINEPHRINE PF 1 MG/ML IJ SOLN
INTRAMUSCULAR | Status: AC
Start: 2018-06-25 — End: ?
  Filled 2018-06-25: qty 2

## 2018-06-25 MED ORDER — LIDOCAINE HCL (PF) 4 % IJ SOLN
INTRAMUSCULAR | Status: AC
Start: 2018-06-25 — End: ?
  Filled 2018-06-25: qty 5

## 2018-06-25 MED ORDER — MOXIFLOXACIN HCL 0.5 % OP SOLN
OPHTHALMIC | Status: AC
  Filled 2018-06-25: qty 3

## 2018-06-25 MED ORDER — EPINEPHRINE PF 1 MG/ML IJ SOLN
INTRAOCULAR | Status: DC | PRN
  Administered 2018-06-25: 08:00:00 via OPHTHALMIC

## 2018-06-25 MED ORDER — MOXIFLOXACIN HCL 0.5 % OP SOLN
OPHTHALMIC | Status: DC | PRN
  Administered 2018-06-25: 0.2 mL via OPHTHALMIC

## 2018-06-25 MED ORDER — SODIUM CHLORIDE 0.9 % IV SOLN
INTRAVENOUS | Status: DC
  Administered 2018-06-25: 06:00:00 via INTRAVENOUS

## 2018-06-25 MED ORDER — SODIUM HYALURONATE 10 MG/ML IO SOLN
INTRAOCULAR | Status: DC | PRN
  Administered 2018-06-25: 0.55 mL via INTRAOCULAR

## 2018-06-25 MED ORDER — MOXIFLOXACIN HCL 0.5 % OP SOLN: 1.0000 [drp] | OPHTHALMIC | Status: DC | PRN

## 2018-06-25 MED ORDER — POVIDONE-IODINE 5 % OP SOLN
OPHTHALMIC | Status: AC
  Filled 2018-06-25: qty 30

## 2018-06-25 MED ORDER — POVIDONE-IODINE 5 % OP SOLN
OPHTHALMIC | Status: DC | PRN
  Administered 2018-06-25: 1 via OPHTHALMIC

## 2018-06-25 MED ORDER — SODIUM HYALURONATE 23 MG/ML IO SOLN
INTRAOCULAR | Status: DC | PRN
  Administered 2018-06-25: 0.6 mL via INTRAOCULAR

## 2018-06-25 SURGICAL SUPPLY — 16 items

## 2018-06-25 NOTE — Anesthesia Preprocedure Evaluation (Signed)
Anesthesia Evaluation  Patient identified by MRN, date of birth, ID band Patient awake    Reviewed: Allergy & Precautions, NPO status , Patient's Chart, lab work & pertinent test results  Airway Mallampati: III       Dental   Pulmonary shortness of breath, asthma , sleep apnea , COPD,    Pulmonary exam normal        Cardiovascular hypertension, + angina + CAD, + Past MI and + Orthopnea  Normal cardiovascular exam+ dysrhythmias Atrial Fibrillation      Neuro/Psych  Headaches, PSYCHIATRIC DISORDERS Depression    GI/Hepatic Neg liver ROS, GERD  ,  Endo/Other  diabetes, Well Controlled, Oral Hypoglycemic Agents, Insulin Dependent  Renal/GU negative Renal ROS  negative genitourinary   Musculoskeletal  (+) Arthritis ,   Abdominal Normal abdominal exam  (+)   Peds negative pediatric ROS (+)  Hematology negative hematology ROS (+)   Anesthesia Other Findings   Reproductive/Obstetrics                             Anesthesia Physical Anesthesia Plan  ASA: III  Anesthesia Plan: MAC   Post-op Pain Management:    Induction:   PONV Risk Score and Plan:   Airway Management Planned: Nasal Cannula  Additional Equipment:   Intra-op Plan:   Post-operative Plan:   Informed Consent: I have reviewed the patients History and Physical, chart, labs and discussed the procedure including the risks, benefits and alternatives for the proposed anesthesia with the patient or authorized representative who has indicated his/her understanding and acceptance.   Dental advisory given  Plan Discussed with: CRNA and Surgeon  Anesthesia Plan Comments:         Anesthesia Quick Evaluation

## 2018-06-25 NOTE — H&P (Signed)
The History and Physical notes are on paper, have been signed, and are to be scanned.   I have examined the patient and there are no changes to the H&P.   Willey BladeBradley Kamryn Gauthier 06/25/2018 7:15 AM

## 2018-06-25 NOTE — Anesthesia Post-op Follow-up Note (Signed)
Anesthesia QCDR form completed.        

## 2018-06-25 NOTE — Anesthesia Postprocedure Evaluation (Signed)
Anesthesia Post Note  Patient: Audrey Hughes  Procedure(s) Performed: CATARACT EXTRACTION PHACO AND INTRAOCULAR LENS PLACEMENT (IOC) (Right Eye)  Patient location during evaluation: PACU Anesthesia Type: MAC Level of consciousness: awake and alert and oriented Pain management: pain level controlled Vital Signs Assessment: post-procedure vital signs reviewed and stable Respiratory status: spontaneous breathing Cardiovascular status: blood pressure returned to baseline Anesthetic complications: no     Last Vitals:  Vitals:   06/25/18 0617 06/25/18 0758  BP: (!) 153/70 121/70  Pulse: 86 82  Resp: (!) 22 20  Temp: 36.9 C 36.9 C  SpO2: 98% 96%    Last Pain:  Vitals:   06/25/18 0758  TempSrc: Oral  PainSc: 0-No pain                 Audrey Hughes

## 2018-06-25 NOTE — Op Note (Signed)
OPERATIVE NOTE  Audrey MaineSusan J Lax [id number] 06/25/2018   PREOPERATIVE DIAGNOSIS:  Nuclear sclerotic cataract right eye.  H25.11   POSTOPERATIVE DIAGNOSIS:    Nuclear sclerotic cataract right eye.     PROCEDURE:  Phacoemusification with posterior chamber intraocular lens placement of the right eye   LENS:   Implant Name Type Inv. Item Serial No. Manufacturer Lot No. LRB No. Used  LENS IOL DIOP 19.5 - [id number] Intraocular Lens LENS IOL DIOP 19.5 [id number] AMO  Right 1       PCB00 +19.5   ULTRASOUND TIME: 0 minutes 26 seconds.  CDE 1.88   SURGEON:  Willey BladeBradley King, MD, MPH  ANESTHESIOLOGIST: Anesthesiologist: Yves Dillarroll, Paul, MD CRNA: Disser, Anne NgAndrew L, CRNA   ANESTHESIA:  Topical with tetracaine drops augmented with 1% preservative-free intracameral lidocaine.  ESTIMATED BLOOD LOSS: less than 1 mL.   COMPLICATIONS:  None.   DESCRIPTION OF PROCEDURE:  The patient was identified in the holding room and transported to the operating room and placed in the supine position under the operating microscope.  The right eye was identified as the operative eye and it was prepped and draped in the usual sterile ophthalmic fashion.   A 1.0 millimeter clear-corneal paracentesis was made at the 10:30 position. 0.5 ml of preservative-free 1% lidocaine with epinephrine was injected into the anterior chamber.  The anterior chamber was filled with Healon 5 viscoelastic.  A 2.4 millimeter keratome was used to make a near-clear corneal incision at the 8:00 position.  A curvilinear capsulorrhexis was made with a cystotome and capsulorrhexis forceps.  Balanced salt solution was used to hydrodissect and hydrodelineate the nucleus.   Phacoemulsification was then used in stop and chop fashion to remove the lens nucleus and epinucleus.  The remaining cortex was then removed using the irrigation and aspiration handpiece. Healon was then placed into the capsular bag to distend it for lens placement.  A lens was then  injected into the capsular bag.  The remaining viscoelastic was aspirated.   Wounds were hydrated with balanced salt solution.  The anterior chamber was inflated to a physiologic pressure with balanced salt solution.   Intracameral vigamox 0.1 mL undiluted was injected into the eye and a drop placed onto the ocular surface.  No wound leaks were noted.  The patient was taken to the recovery room in stable condition without complications of anesthesia or surgery  Willey BladeBradley King 06/25/2018, 7:56 AM

## 2018-06-25 NOTE — Discharge Instructions (Addendum)
° °  Eye Surgery Discharge Instructions    Expect mild scratchy sensation or mild soreness. DO NOT RUB YOUR EYE!  The day of surgery:  Minimal physical activity, but bed rest is not required  No reading, computer work, or close hand work  No bending, lifting, or straining.  May watch TV  For 24 hours:  No driving, legal decisions, or alcoholic beverages  Safety precautions  Eat anything you prefer: It is better to start with liquids, then soup then solid foods.  Solar shield eyeglasses should be worn for comfort in the sunlight/patch while sleeping  Resume all regular medications including aspirin or Coumadin if these were discontinued prior to surgery. You may shower, bathe, shave, or wash your hair. Tylenol may be taken for mild discomfort. FOLLOW DR. Elmer BalesKING'S POSTOP EYE DROP INSTRUCTION SHEET AS REVIEWED.  Call your doctor if you experience significant pain, nausea, or vomiting, fever > 101 or other signs of infection. [phone] or [phone] Specific instructions:  Follow-up Information    [place] CraneKing, Bradley Mark, MD Follow up.   Specialty:  Ophthalmology Why:  06/26/18 @ 9:45 am Contact information: [address] [place] [id number] [phone]

## 2018-06-25 NOTE — Transfer of Care (Signed)
Immediate Anesthesia Transfer of Care Note  Patient: Audrey Hughes  Procedure(s) Performed: CATARACT EXTRACTION PHACO AND INTRAOCULAR LENS PLACEMENT (IOC) (Right Eye)  Patient Location: PACU  Anesthesia Type:MAC  Level of Consciousness: awake, alert  and oriented  Airway & Oxygen Therapy: Patient Spontanous Breathing  Post-op Assessment: Report given to RN and Post -op Vital signs reviewed and stable  Post vital signs: Reviewed and stable  Last Vitals:  Vitals Value Taken Time  BP    Temp 36.9 C 06/25/2018  7:58 AM  Pulse 82 06/25/2018  7:58 AM  Resp 20 06/25/2018  7:58 AM  SpO2 96 % 06/25/2018  7:58 AM    Last Pain:  Vitals:   06/25/18 0758  TempSrc: Oral  PainSc: 0-No pain         Complications: No apparent anesthesia complications

## 2018-06-26 ENCOUNTER — Encounter: Payer: Self-pay | Admitting: Ophthalmology

## 2018-06-26 NOTE — Addendum Note (Signed)
Addendum  created 06/26/18 1148 by Stormy Fabianurtis, Tyneka Scafidi, CRNA   Charge Capture section accepted

## 2018-06-26 NOTE — Addendum Note (Signed)
Addendum  created 06/26/18 0928 by Daelon Dunivan L, CRNA   Charge Capture section accepted    

## 2019-05-28 ENCOUNTER — Inpatient Hospital Stay: Admission: RE | Admit: 2019-05-28 | Payer: Medicare Other | Source: Ambulatory Visit

## 2019-06-02 ENCOUNTER — Ambulatory Visit: Admit: 2019-06-02 | Payer: Medicare Other | Admitting: Ophthalmology

## 2019-06-02 SURGERY — PHACOEMULSIFICATION, CATARACT, WITH IOL INSERTION
Anesthesia: Choice | Laterality: Right

## 2019-06-29 ENCOUNTER — Other Ambulatory Visit: Payer: Self-pay

## 2019-06-29 ENCOUNTER — Encounter: Payer: Self-pay | Admitting: Anesthesiology

## 2019-06-29 ENCOUNTER — Encounter: Payer: Self-pay | Admitting: *Deleted

## 2019-07-01 ENCOUNTER — Other Ambulatory Visit: Payer: Self-pay

## 2019-07-01 ENCOUNTER — Other Ambulatory Visit
Admission: RE | Admit: 2019-07-01 | Discharge: 2019-07-01 | Disposition: A | Discharge status: Home / Self Care | Payer: Medicare Other | Source: Ambulatory Visit | Attending: Ophthalmology | Admitting: Ophthalmology

## 2019-07-01 DIAGNOSIS — Z01812 Encounter for preprocedural laboratory examination: Secondary | ICD-10-CM | POA: Insufficient documentation

## 2019-07-01 DIAGNOSIS — Z1159 Encounter for screening for other viral diseases: Secondary | ICD-10-CM | POA: Diagnosis not present

## 2019-07-01 NOTE — Discharge Instructions (Signed)

## 2019-07-02 LAB — SARS CORONAVIRUS 2 (TAT 6-24 HRS): SARS Coronavirus 2: NEGATIVE

## 2019-07-05 ENCOUNTER — Ambulatory Visit: Admission: RE | Admit: 2019-07-05 | Payer: Medicare Other | Source: Home / Self Care | Admitting: Ophthalmology

## 2019-07-05 SURGERY — PHACOEMULSIFICATION, CATARACT, WITH IOL INSERTION
Anesthesia: Topical | Laterality: Left

## 2019-07-12 ENCOUNTER — Encounter: Payer: Self-pay | Admitting: *Deleted

## 2019-07-12 ENCOUNTER — Other Ambulatory Visit: Payer: Self-pay

## 2019-07-15 ENCOUNTER — Other Ambulatory Visit
Admission: RE | Admit: 2019-07-15 | Discharge: 2019-07-15 | Disposition: A | Discharge status: Home / Self Care | Payer: Medicare Other | Source: Ambulatory Visit | Attending: Ophthalmology | Admitting: Ophthalmology

## 2019-07-15 ENCOUNTER — Other Ambulatory Visit: Payer: Self-pay

## 2019-07-15 DIAGNOSIS — Z1159 Encounter for screening for other viral diseases: Secondary | ICD-10-CM | POA: Insufficient documentation

## 2019-07-15 NOTE — Discharge Instructions (Signed)

## 2019-07-16 LAB — SARS CORONAVIRUS 2 (TAT 6-24 HRS): SARS Coronavirus 2: NEGATIVE

## 2019-07-19 ENCOUNTER — Ambulatory Visit
Admission: RE | Admit: 2019-07-19 | Discharge: 2019-07-19 | Disposition: A | Discharge status: Home / Self Care | Payer: Medicare Other | Attending: Ophthalmology | Admitting: Ophthalmology

## 2019-07-19 ENCOUNTER — Ambulatory Visit: Payer: Medicare Other | Admitting: Anesthesiology

## 2019-07-19 ENCOUNTER — Other Ambulatory Visit: Payer: Self-pay

## 2019-07-19 ENCOUNTER — Encounter
Admission: RE | Disposition: A | Discharge status: Home / Self Care | Payer: Self-pay | Source: Home / Self Care | Attending: Ophthalmology

## 2019-07-19 DIAGNOSIS — J449 Chronic obstructive pulmonary disease, unspecified: Secondary | ICD-10-CM | POA: Diagnosis not present

## 2019-07-19 DIAGNOSIS — Z7982 Long term (current) use of aspirin: Secondary | ICD-10-CM | POA: Diagnosis not present

## 2019-07-19 DIAGNOSIS — G20 Parkinson's disease: Secondary | ICD-10-CM | POA: Insufficient documentation

## 2019-07-19 DIAGNOSIS — Z96652 Presence of left artificial knee joint: Secondary | ICD-10-CM | POA: Diagnosis not present

## 2019-07-19 DIAGNOSIS — D649 Anemia, unspecified: Secondary | ICD-10-CM | POA: Diagnosis not present

## 2019-07-19 DIAGNOSIS — I10 Essential (primary) hypertension: Secondary | ICD-10-CM | POA: Insufficient documentation

## 2019-07-19 DIAGNOSIS — Z86718 Personal history of other venous thrombosis and embolism: Secondary | ICD-10-CM | POA: Insufficient documentation

## 2019-07-19 DIAGNOSIS — E1136 Type 2 diabetes mellitus with diabetic cataract: Secondary | ICD-10-CM | POA: Diagnosis not present

## 2019-07-19 DIAGNOSIS — I252 Old myocardial infarction: Secondary | ICD-10-CM | POA: Insufficient documentation

## 2019-07-19 DIAGNOSIS — E1142 Type 2 diabetes mellitus with diabetic polyneuropathy: Secondary | ICD-10-CM | POA: Diagnosis not present

## 2019-07-19 DIAGNOSIS — I2510 Atherosclerotic heart disease of native coronary artery without angina pectoris: Secondary | ICD-10-CM | POA: Diagnosis not present

## 2019-07-19 DIAGNOSIS — Z6841 Body Mass Index (BMI) 40.0 and over, adult: Secondary | ICD-10-CM | POA: Insufficient documentation

## 2019-07-19 DIAGNOSIS — Z79899 Other long term (current) drug therapy: Secondary | ICD-10-CM | POA: Insufficient documentation

## 2019-07-19 DIAGNOSIS — Z794 Long term (current) use of insulin: Secondary | ICD-10-CM | POA: Diagnosis not present

## 2019-07-19 DIAGNOSIS — K219 Gastro-esophageal reflux disease without esophagitis: Secondary | ICD-10-CM | POA: Insufficient documentation

## 2019-07-19 DIAGNOSIS — F329 Major depressive disorder, single episode, unspecified: Secondary | ICD-10-CM | POA: Diagnosis not present

## 2019-07-19 DIAGNOSIS — Z7901 Long term (current) use of anticoagulants: Secondary | ICD-10-CM | POA: Diagnosis not present

## 2019-07-19 DIAGNOSIS — Z9981 Dependence on supplemental oxygen: Secondary | ICD-10-CM | POA: Diagnosis not present

## 2019-07-19 DIAGNOSIS — E785 Hyperlipidemia, unspecified: Secondary | ICD-10-CM | POA: Insufficient documentation

## 2019-07-19 DIAGNOSIS — G4730 Sleep apnea, unspecified: Secondary | ICD-10-CM | POA: Insufficient documentation

## 2019-07-19 DIAGNOSIS — H2512 Age-related nuclear cataract, left eye: Secondary | ICD-10-CM | POA: Insufficient documentation

## 2019-07-19 HISTORY — PX: CATARACT EXTRACTION W/PHACO: SHX586

## 2019-07-19 LAB — GLUCOSE, CAPILLARY
Glucose-Capillary: 108 mg/dL — ABNORMAL HIGH (ref 70–99)
Glucose-Capillary: 101 mg/dL — ABNORMAL HIGH (ref 70–99)

## 2019-07-19 SURGERY — PHACOEMULSIFICATION, CATARACT, WITH IOL INSERTION
Anesthesia: Monitor Anesthesia Care | Site: Eye | Laterality: Left

## 2019-07-19 MED ORDER — CYCLOPENTOLATE HCL 2 % OP SOLN
1.0000 [drp] | OPHTHALMIC | Status: DC | PRN
  Administered 2019-07-19 (×3): 1 [drp] via OPHTHALMIC

## 2019-07-19 MED ORDER — MIDAZOLAM HCL 2 MG/2ML IJ SOLN
INTRAMUSCULAR | Status: DC | PRN
  Administered 2019-07-19 (×2): 1 mg via INTRAVENOUS

## 2019-07-19 MED ORDER — MOXIFLOXACIN HCL 0.5 % OP SOLN
OPHTHALMIC | Status: DC | PRN
  Administered 2019-07-19: 0.2 mL via OPHTHALMIC

## 2019-07-19 MED ORDER — LIDOCAINE HCL (PF) 2 % IJ SOLN
INTRAOCULAR | Status: DC | PRN
  Administered 2019-07-19: 1 mL via INTRAOCULAR

## 2019-07-19 MED ORDER — SODIUM HYALURONATE 10 MG/ML IO SOLN
INTRAOCULAR | Status: DC | PRN
  Administered 2019-07-19: 0.55 mL via INTRAOCULAR

## 2019-07-19 MED ORDER — SODIUM HYALURONATE 23 MG/ML IO SOLN
INTRAOCULAR | Status: DC | PRN
  Administered 2019-07-19: 0.6 mL via INTRAOCULAR

## 2019-07-19 MED ORDER — TETRACAINE HCL 0.5 % OP SOLN
1.0000 [drp] | OPHTHALMIC | Status: DC | PRN
  Administered 2019-07-19 (×3): 1 [drp] via OPHTHALMIC

## 2019-07-19 MED ORDER — PHENYLEPHRINE HCL 10 % OP SOLN
1.0000 [drp] | OPHTHALMIC | Status: DC | PRN
  Administered 2019-07-19 (×3): 1 [drp] via OPHTHALMIC

## 2019-07-19 MED ORDER — PHENYLEPHRINE HCL 2.5 % OP SOLN: 1.0000 [drp] | OPHTHALMIC | Status: DC | PRN

## 2019-07-19 MED ORDER — EPINEPHRINE PF 1 MG/ML IJ SOLN
INTRAOCULAR | Status: DC | PRN
  Administered 2019-07-19: 75 mL via OPHTHALMIC

## 2019-07-19 SURGICAL SUPPLY — 19 items
CANNULA ANT/CHMB 27G (MISCELLANEOUS) ×2 IMPLANT
CANNULA ANT/CHMB 27GA (MISCELLANEOUS) ×6 IMPLANT
DISSECTOR HYDRO NUCLEUS 50X22 (MISCELLANEOUS) ×3 IMPLANT
GLOVE SURG LX 7.5 STRW (GLOVE) ×2
GLOVE SURG LX STRL 7.5 STRW (GLOVE) ×1 IMPLANT
GLOVE SURG SYN 8.5  E (GLOVE) ×2
GLOVE SURG SYN 8.5 E (GLOVE) ×1 IMPLANT
GLOVE SURG SYN 8.5 PF PI (GLOVE) ×1 IMPLANT
GOWN STRL REUS W/ TWL LRG LVL3 (GOWN DISPOSABLE) ×2 IMPLANT
GOWN STRL REUS W/TWL LRG LVL3 (GOWN DISPOSABLE) ×4
LENS IOL TECNIS ITEC 19.0 (Intraocular Lens) ×2 IMPLANT
MARKER SKIN DUAL TIP RULER LAB (MISCELLANEOUS) ×3 IMPLANT
PACK DR. KING ARMS (PACKS) ×3 IMPLANT
PACK EYE AFTER SURG (MISCELLANEOUS) ×3 IMPLANT
PACK OPTHALMIC (MISCELLANEOUS) ×3 IMPLANT
SYR 3ML LL SCALE MARK (SYRINGE) ×3 IMPLANT
SYR TB 1ML LUER SLIP (SYRINGE) ×3 IMPLANT
WATER STERILE IRR 250ML POUR (IV SOLUTION) ×3 IMPLANT
WIPE NON LINTING 3.25X3.25 (MISCELLANEOUS) ×3 IMPLANT

## 2019-07-19 NOTE — Op Note (Signed)
OPERATIVE NOTE  Audrey Hughes [id number] 07/19/2019   PREOPERATIVE DIAGNOSIS:  Nuclear sclerotic cataract left eye.  H25.12   POSTOPERATIVE DIAGNOSIS:    Nuclear sclerotic cataract left eye.     PROCEDURE:  Phacoemusification with posterior chamber intraocular lens placement of the left eye   LENS:   Implant Name Type Inv. Item Serial No. Manufacturer Lot No. LRB No. Used Action  LENS IOL DIOP 19.0 - [id number] Intraocular Lens LENS IOL DIOP 19.0 [id number]  Left 1 Implanted       PCB00 +19.0   ULTRASOUND TIME: 0 minutes 52 seconds.  CDE 3.86   SURGEON:  Benay Pillow, MD, MPH   ANESTHESIA:  Topical with tetracaine drops augmented with 1% preservative-free intracameral lidocaine.  ESTIMATED BLOOD LOSS: <1 mL   COMPLICATIONS:  None.   DESCRIPTION OF PROCEDURE:  The patient was identified in the holding room and transported to the operating room and placed in the supine position under the operating microscope.  The left eye was identified as the operative eye and it was prepped and draped in the usual sterile ophthalmic fashion.   A 1.0 millimeter clear-corneal paracentesis was made at the 5:00 position. 0.5 ml of preservative-free 1% lidocaine with epinephrine was injected into the anterior chamber.  The anterior chamber was filled with Healon 5 viscoelastic.  A 2.4 millimeter keratome was used to make a near-clear corneal incision at the 2:00 position.  A curvilinear capsulorrhexis was made with a cystotome and capsulorrhexis forceps.  Balanced salt solution was used to hydrodissect and hydrodelineate the nucleus.   Phacoemulsification was then used in stop and chop fashion to remove the lens nucleus and epinucleus.  The remaining cortex was then removed using the irrigation and aspiration handpiece. Healon was then placed into the capsular bag to distend it for lens placement.  A lens was then injected into the capsular bag.  The remaining viscoelastic was aspirated.   Wounds  were hydrated with balanced salt solution.  The anterior chamber was inflated to a physiologic pressure with balanced salt solution.  Intracameral vigamox 0.1 mL undiltued was injected into the eye and a drop placed onto the ocular surface.  No wound leaks were noted.  The patient was taken to the recovery room in stable condition without complications of anesthesia or surgery  Benay Pillow 07/19/2019, 11:29 AM

## 2019-07-19 NOTE — H&P (Signed)

## 2019-07-19 NOTE — Transfer of Care (Signed)
Immediate Anesthesia Transfer of Care Note  Patient: Audrey Hughes  Procedure(s) Performed: CATARACT EXTRACTION PHACO AND INTRAOCULAR LENS PLACEMENT (IOC)  LEFT DIABETIC (Left Eye)  Patient Location: PACU  Anesthesia Type: MAC  Level of Consciousness: awake, alert  and patient cooperative  Airway and Oxygen Therapy: Patient Spontanous Breathing and Patient connected to supplemental oxygen  Post-op Assessment: Post-op Vital signs reviewed, Patient's Cardiovascular Status Stable, Respiratory Function Stable, Patent Airway and No signs of Nausea or vomiting  Post-op Vital Signs: Reviewed and stable  Complications: No apparent anesthesia complications

## 2019-07-19 NOTE — Anesthesia Procedure Notes (Signed)
Procedure Name: MAC Date/Time: 07/19/2019 11:04 AM Performed by: Cameron Ali, CRNA Pre-anesthesia Checklist: Patient identified, Emergency Drugs available, Suction available, Timeout performed and Patient being monitored Patient Re-evaluated:Patient Re-evaluated prior to induction Oxygen Delivery Method: Nasal cannula Placement Confirmation: positive ETCO2

## 2019-07-19 NOTE — Anesthesia Preprocedure Evaluation (Signed)
Anesthesia Evaluation  Patient identified by MRN, date of birth, ID band Patient awake    History of Anesthesia Complications Negative for: history of anesthetic complications  Airway Mallampati: I  TM Distance: <3 FB Neck ROM: Full    Dental  (+) Partial Upper   Pulmonary asthma , sleep apnea , COPD,    Pulmonary exam normal        Cardiovascular hypertension, + CAD and + Past MI (2016, no stents)  Normal cardiovascular exam+ dysrhythmias Atrial Fibrillation      Neuro/Psych PSYCHIATRIC DISORDERS Depression    GI/Hepatic GERD  Medicated and Controlled,  Endo/Other  diabetes, Type 2Morbid obesity  Renal/GU      Musculoskeletal   Abdominal   Peds  Hematology   Anesthesia Other Findings   Reproductive/Obstetrics                             Anesthesia Physical Anesthesia Plan  ASA: III  Anesthesia Plan: MAC   Post-op Pain Management:    Induction: Intravenous  PONV Risk Score and Plan:   Airway Management Planned: Nasal Cannula  Additional Equipment: None  Intra-op Plan:   Post-operative Plan:   Informed Consent: I have reviewed the patients History and Physical, chart, labs and discussed the procedure including the risks, benefits and alternatives for the proposed anesthesia with the patient or authorized representative who has indicated his/her understanding and acceptance.       Plan Discussed with: CRNA  Anesthesia Plan Comments:         Anesthesia Quick Evaluation

## 2019-07-19 NOTE — Anesthesia Postprocedure Evaluation (Signed)
Anesthesia Post Note  Patient: Audrey Hughes  Procedure(s) Performed: CATARACT EXTRACTION PHACO AND INTRAOCULAR LENS PLACEMENT (IOC)  LEFT DIABETIC (Left Eye)  Patient location during evaluation: PACU Anesthesia Type: MAC Level of consciousness: awake and alert Pain management: pain level controlled Vital Signs Assessment: post-procedure vital signs reviewed and stable Respiratory status: spontaneous breathing, nonlabored ventilation, respiratory function stable and patient connected to nasal cannula oxygen Cardiovascular status: stable and blood pressure returned to baseline Postop Assessment: no apparent nausea or vomiting Anesthetic complications: no    Adele Barthel Emmilyn Crooke

## 2019-07-20 ENCOUNTER — Encounter: Payer: Self-pay | Admitting: Ophthalmology

## 2019-07-29 ENCOUNTER — Other Ambulatory Visit: Payer: Self-pay | Admitting: Internal Medicine

## 2019-07-29 DIAGNOSIS — I480 Paroxysmal atrial fibrillation: Secondary | ICD-10-CM

## 2019-08-03 ENCOUNTER — Other Ambulatory Visit: Payer: Self-pay | Admitting: Family Medicine

## 2019-08-03 DIAGNOSIS — Z1231 Encounter for screening mammogram for malignant neoplasm of breast: Secondary | ICD-10-CM

## 2019-08-10 ENCOUNTER — Encounter
Admission: RE | Admit: 2019-08-10 | Discharge: 2019-08-10 | Disposition: A | Discharge status: Home / Self Care | Payer: Medicare Other | Source: Ambulatory Visit | Attending: Internal Medicine | Admitting: Internal Medicine

## 2019-08-10 ENCOUNTER — Other Ambulatory Visit: Payer: Self-pay

## 2019-08-10 DIAGNOSIS — I480 Paroxysmal atrial fibrillation: Secondary | ICD-10-CM | POA: Diagnosis present

## 2019-08-10 MED ORDER — REGADENOSON 0.4 MG/5ML IV SOLN
0.4000 mg | Freq: Once | INTRAVENOUS | Status: AC
  Administered 2019-08-10: 12:00:00 0.4 mg via INTRAVENOUS

## 2019-08-10 MED ORDER — TECHNETIUM TC 99M TETROFOSMIN IV KIT
30.0000 | PACK | Freq: Once | INTRAVENOUS | Status: AC | PRN
  Administered 2019-08-10: 12:00:00 29.04 via INTRAVENOUS

## 2019-08-12 ENCOUNTER — Other Ambulatory Visit: Payer: Self-pay

## 2019-08-12 ENCOUNTER — Ambulatory Visit
Admission: RE | Admit: 2019-08-12 | Discharge: 2019-08-12 | Disposition: A | Discharge status: Home / Self Care | Payer: Medicare Other | Source: Ambulatory Visit | Attending: Internal Medicine | Admitting: Internal Medicine

## 2019-08-12 MED ORDER — TECHNETIUM TC 99M TETROFOSMIN IV KIT
30.4200 | PACK | Freq: Once | INTRAVENOUS | Status: AC | PRN
  Administered 2019-08-12: 11:00:00 30.42 via INTRAVENOUS

## 2019-08-17 LAB — NM MYOCAR MULTI W/SPECT W/WALL MOTION / EF
Estimated workload: 1 METS
Exercise duration (min): 1 min
Exercise duration (sec): 13 s
MPHR: 36 {beats}/min
Peak HR: 100 {beats}/min
Rest HR: 77 {beats}/min

## 2020-04-14 ENCOUNTER — Other Ambulatory Visit: Payer: Self-pay

## 2020-04-14 ENCOUNTER — Emergency Department: Payer: Medicare Other

## 2020-04-14 ENCOUNTER — Emergency Department
Admission: EM | Admit: 2020-04-14 | Discharge: 2020-04-14 | Disposition: A | Discharge status: Home / Self Care | Payer: Medicare Other | Attending: Emergency Medicine | Admitting: Emergency Medicine

## 2020-04-14 DIAGNOSIS — I4891 Unspecified atrial fibrillation: Secondary | ICD-10-CM | POA: Diagnosis not present

## 2020-04-14 DIAGNOSIS — E119 Type 2 diabetes mellitus without complications: Secondary | ICD-10-CM | POA: Diagnosis not present

## 2020-04-14 DIAGNOSIS — Z9660 Presence of unspecified orthopedic joint implant: Secondary | ICD-10-CM | POA: Diagnosis not present

## 2020-04-14 DIAGNOSIS — I2510 Atherosclerotic heart disease of native coronary artery without angina pectoris: Secondary | ICD-10-CM | POA: Diagnosis not present

## 2020-04-14 DIAGNOSIS — Z7901 Long term (current) use of anticoagulants: Secondary | ICD-10-CM | POA: Insufficient documentation

## 2020-04-14 DIAGNOSIS — Z79899 Other long term (current) drug therapy: Secondary | ICD-10-CM | POA: Insufficient documentation

## 2020-04-14 DIAGNOSIS — I10 Essential (primary) hypertension: Secondary | ICD-10-CM | POA: Insufficient documentation

## 2020-04-14 DIAGNOSIS — G20 Parkinson's disease: Secondary | ICD-10-CM | POA: Diagnosis not present

## 2020-04-14 DIAGNOSIS — R079 Chest pain, unspecified: Secondary | ICD-10-CM | POA: Diagnosis present

## 2020-04-14 DIAGNOSIS — J45909 Unspecified asthma, uncomplicated: Secondary | ICD-10-CM | POA: Insufficient documentation

## 2020-04-14 DIAGNOSIS — Z794 Long term (current) use of insulin: Secondary | ICD-10-CM | POA: Insufficient documentation

## 2020-04-14 DIAGNOSIS — Z7982 Long term (current) use of aspirin: Secondary | ICD-10-CM | POA: Diagnosis not present

## 2020-04-14 LAB — CBC WITH DIFFERENTIAL/PLATELET
Abs Immature Granulocytes: 0.03 10*3/uL (ref 0.00–0.07)
Basophils Absolute: 0 10*3/uL (ref 0.0–0.1)
Basophils Relative: 1 %
Eosinophils Absolute: 0.2 10*3/uL (ref 0.0–0.5)
Eosinophils Relative: 3 %
HCT: 45.3 % (ref 36.0–46.0)
Hemoglobin: 13.9 g/dL (ref 12.0–15.0)
Immature Granulocytes: 1 %
Lymphocytes Relative: 22 %
Lymphs Abs: 1.5 10*3/uL (ref 0.7–4.0)
MCH: 26 pg (ref 26.0–34.0)
MCHC: 30.7 g/dL (ref 30.0–36.0)
MCV: 84.7 fL (ref 80.0–100.0)
Monocytes Absolute: 0.5 10*3/uL (ref 0.1–1.0)
Monocytes Relative: 8 %
Neutro Abs: 4.3 10*3/uL (ref 1.7–7.7)
Neutrophils Relative %: 65 %
Platelets: 219 10*3/uL (ref 150–400)
RBC: 5.35 MIL/uL — ABNORMAL HIGH (ref 3.87–5.11)
RDW: 16 % — ABNORMAL HIGH (ref 11.5–15.5)
WBC: 6.5 10*3/uL (ref 4.0–10.5)
nRBC: 0 % (ref 0.0–0.2)

## 2020-04-14 LAB — TROPONIN I (HIGH SENSITIVITY)
Troponin I (High Sensitivity): 21 ng/L — ABNORMAL HIGH (ref <18)
Troponin I (High Sensitivity): 42 ng/L — ABNORMAL HIGH (ref <18)

## 2020-04-14 LAB — BASIC METABOLIC PANEL
Anion gap: 8 (ref 5–15)
BUN: 25 mg/dL — ABNORMAL HIGH (ref 8–23)
CO2: 28 mmol/L (ref 22–32)
Calcium: 8.9 mg/dL (ref 8.9–10.3)
Chloride: 106 mmol/L (ref 98–111)
Creatinine, Ser: 1.23 mg/dL — ABNORMAL HIGH (ref 0.44–1.00)
GFR calc Af Amer: 54 mL/min — ABNORMAL LOW (ref >60)
GFR calc non Af Amer: 47 mL/min — ABNORMAL LOW (ref >60)
Glucose, Bld: 161 mg/dL — ABNORMAL HIGH (ref 70–99)
Potassium: 4.5 mmol/L (ref 3.5–5.1)
Sodium: 142 mmol/L (ref 135–145)

## 2020-04-14 LAB — PROTIME-INR
INR: 2.1 — ABNORMAL HIGH (ref 0.8–1.2)
Prothrombin Time: 23.1 seconds — ABNORMAL HIGH (ref 11.4–15.2)

## 2020-04-14 LAB — MAGNESIUM: Magnesium: 1.6 mg/dL — ABNORMAL LOW (ref 1.7–2.4)

## 2020-04-14 MED ORDER — METOPROLOL SUCCINATE ER 50 MG PO TB24
25.0000 mg | ORAL_TABLET | Freq: Once | ORAL | Status: AC
  Administered 2020-04-14: 25 mg via ORAL
  Filled 2020-04-14: qty 1

## 2020-04-14 MED ORDER — METOPROLOL TARTRATE 5 MG/5ML IV SOLN: 5.0000 mg | INTRAVENOUS | Status: DC | PRN

## 2020-04-14 MED ORDER — MAGNESIUM SULFATE 2 GM/50ML IV SOLN
2.0000 g | Freq: Once | INTRAVENOUS | Status: AC
  Administered 2020-04-14: 2 g via INTRAVENOUS
  Filled 2020-04-14: qty 50

## 2020-04-14 MED ORDER — METOPROLOL SUCCINATE ER 50 MG PO TB24: 50.0000 mg | ORAL_TABLET | Freq: Every day | ORAL | 1 refills | Status: AC

## 2020-04-14 NOTE — ED Notes (Signed)
Pt states she thinks she can safely get back to facility via a friend. [facility] she can safely transfer to and from wheelchair/vehicle.

## 2020-04-14 NOTE — ED Notes (Signed)
Pt wheeled out to friend's vehicle.

## 2020-04-14 NOTE — ED Notes (Signed)
Rainbow of tubes sent to lab. 

## 2020-04-14 NOTE — ED Notes (Signed)
EDP [place] notified of trop 42.

## 2020-04-14 NOTE — ED Notes (Signed)
Secretary notified pt would need EMS transport back to facility.

## 2020-04-14 NOTE — ED Notes (Signed)
Pt reports that her ride is almost here.

## 2020-04-14 NOTE — ED Provider Notes (Signed)
[facility] Emergency Department Provider Note   ____________________________________________   First MD Initiated Contact with Patient 04/14/20 1316     (approximate)  I have reviewed the triage vital signs and the nursing notes.   HISTORY  Chief Complaint Atrial Fibrillation    HPI Audrey Hughes is a 64 y.o. female with past medical history of CAD, hypertension, hyperlipidemia, COPD, Parkinson disease, and A. fib on Coumadin presents to the ED for atrial fibrillation.  Patient reports that she woke up this morning with sharp pain in the center of her chest that has been constant, not exacerbated or alleviated by anything.  She was scheduled for COVID-19 vaccine at her PCPs office, however when she arrived and was complaining of chest pain they performed an EKG and found her to be in atrial fibrillation with RVR.  EMS was called and due to concern for SVT, patient was given 6 mg and then 12 mg of adenosine.  There was no change in her heart rate and she was also given 2.5 mg of metoprolol.  Patient currently complains of ongoing chest pain as well as some mild shortness of breath, denies any recent fevers or cough.  She has not noticed any pain or swelling in her legs.  She denies missing any doses of her metoprolol, did take it earlier this morning along with her Coumadin.        Past Medical History:  Diagnosis Date  . Arthritis   . Asthma   . CAD (coronary artery disease)    s/p MI  . COPD (chronic obstructive pulmonary disease) (HCC)   . Depression   . Diabetes mellitus (HCC)   . DVT (deep venous thrombosis) (HCC)    left thigh 2 months ago  . Dyspnea    severe dyspnea/ possible pickwickian  . Dysrhythmia   . Edema   . GERD (gastroesophageal reflux disease)   . Headache    migraines  . History of kidney stones   . History of orthopnea   . HTN (hypertension)   . Meige's syndrome (blepharospasm with oromandibular dystonia)   . Mobility poor     . Myocardial infarction (HCC)    2016  . Neuropathy   . Obesity   . OSA (obstructive sleep apnea)   . Oxygen deficiency    2l/ hs  . Parkinson disease (HCC)   . Paroxysmal atrial flutter (HCC)   . Tremor   . VIN I (vulvar intraepithelial neoplasia I)     Patient Active Problem List   Diagnosis Date Noted  . 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 Chest pain 07/09/2015  . Angina at rest [facility]) 07/08/2015  . CAD (coronary artery disease) 07/08/2015  . Diabetes type 2, uncontrolled (HCC) 07/08/2015  . Asthma 07/08/2015  . HTN (hypertension) 07/08/2015  . Depression 07/08/2015  . Paroxysmal atrial flutter (HCC) 07/08/2015  . GERD (gastroesophageal reflux disease) 07/08/2015    Past Surgical History:  Procedure Laterality Date  . CATARACT EXTRACTION W/PHACO Right 06/25/2018   Procedure: CATARACT EXTRACTION PHACO AND INTRAOCULAR LENS PLACEMENT (IOC);  Surgeon: Nevada Crane, MD;  Location: [facility] ORS;  Service: Ophthalmology;  Laterality: Right;  [place] 00:26.3 AP% 7.1 CDE 1.88 Fluid Pak lot # [id number]  . CATARACT EXTRACTION W/PHACO Left 07/19/2019   Procedure: CATARACT EXTRACTION PHACO AND INTRAOCULAR LENS PLACEMENT (IOC)  LEFT DIABETIC;  Surgeon: Nevada Crane, MD;  Location: [facility];  Service: Ophthalmology;  Laterality: Left;  Diabetic - insulin and oal meds  . CESAREAN SECTION    . CHOLECYSTECTOMY    . JOINT REPLACEMENT    . KIDNEY STONE SURGERY    . LAPAROSCOPIC TUBAL LIGATION    . TONSILLECTOMY AND ADENOIDECTOMY    . TUBAL LIGATION      Prior to Admission medications   Medication Sig Start Date End Date Taking? Authorizing Provider  acetaminophen (TYLENOL) 500 MG tablet Take 500 mg by mouth every 6 (six) hours as needed for moderate pain.    [provider]  albuterol (PROVENTIL) (2.5 MG/3ML) 0.083% nebulizer solution Take 2.5 mg by nebulization every 6 (six) hours as  needed for wheezing or shortness of breath.    [provider]  aspirin 81 MG tablet Take 81 mg by mouth daily.    [provider]  Dulaglutide (TRULICITY) 0.75 MG/0.5ML SOPN Inject into the skin once a week. Fridays    [provider]  ferrous sulfate 325 (65 FE) MG tablet Take 325 mg by mouth daily with breakfast.    [provider]  gabapentin (NEURONTIN) 300 MG capsule Take 300 mg by mouth 2 (two) times daily.    [provider]  HYDROcodone-acetaminophen (NORCO) 10-325 MG per tablet Take 1 tablet by mouth 3 (three) times daily.     [provider]  indapamide (LOZOL) 2.5 MG tablet Take 2.5 mg by mouth daily.     [provider]  insulin regular human CONCENTRATED (HUMULIN R U-500 KWIKPEN) 500 UNIT/ML kwikpen Inject 105-110 Units into the skin See admin instructions. Inject 110 units in the morning and 105 units in the evening (06/29/19  Pt reports 95 units bid)    [provider]  metFORMIN (GLUCOPHAGE) 500 MG tablet Take 500 mg by mouth 2 (two) times daily with a meal.     [provider]  metoprolol succinate (TOPROL XL) 50 MG 24 hr tablet Take 1 tablet (50 mg total) by mouth daily. Take with or immediately following a meal. 04/14/20 06/13/20  Chesley Noon, MD  Multiple Vitamins-Minerals (WOMENS 50+ MULTI VITAMIN/MIN PO) Take 1 tablet by mouth daily.    [provider]  nystatin cream (MYCOSTATIN) Apply 1 application topically once a week.     [provider]  OXYGEN Inhale 2 L into the lungs at bedtime. (Also uses when "hot")    [provider]  pantoprazole (PROTONIX) 40 MG tablet Take 40 mg by mouth daily.    [provider]  PARoxetine (PAXIL) 40 MG tablet Take 40 mg by mouth daily.     [provider]  quinapril (ACCUPRIL) 10 MG tablet Take 10 mg by mouth daily.     [provider]  rosuvastatin (CRESTOR) 10 MG tablet Take 10 mg by mouth every evening.      [provider]  warfarin (COUMADIN) 5 MG tablet Take 5 mg by mouth at bedtime.     [provider]    Allergies Ketorolac, Diltiazem, Flagyl [metronidazole], Sulfa antibiotics, and Toradol [ketorolac tromethamine]  Family History  Problem Relation Age of Onset  . Diabetes Mother   . Heart disease Mother   . COPD Father   . Hypertension Father   . Breast cancer Neg Hx     Social History Social History   Tobacco Use  . Smoking status: Never Smoker  . Smokeless tobacco: Never Used  Substance Use Topics  . Alcohol use: No    Alcohol/week: 0.0 standard drinks  Comment: 1 can of beer  . Drug use: No    Review of Systems  Constitutional: No fever/chills Eyes: No visual changes. ENT: No sore throat. Cardiovascular: Positive for chest pain. Respiratory: Positive for shortness of breath. Gastrointestinal: No abdominal pain.  No nausea, no vomiting.  No diarrhea.  No constipation. Genitourinary: Negative for dysuria. Musculoskeletal: Negative for back pain. Skin: Negative for rash. Neurological: Negative for headaches, focal weakness or numbness.  ____________________________________________   PHYSICAL EXAM:  VITAL SIGNS: ED Triage Vitals  Enc Vitals Group     BP 04/14/20 1307 112/62     Pulse Rate 04/14/20 1309 67     Resp 04/14/20 1316 (!) 23     Temp 04/14/20 1309 98.3 F (36.8 C)     Temp Source 04/14/20 1309 Oral     SpO2 04/14/20 1306 100 %     Weight 04/14/20 1310 (!) 380 lb (172.4 kg)     Height 04/14/20 1310 6' (1.829 m)     Head Circumference --      Peak Flow --      Pain Score 04/14/20 1309 0     Pain Loc --      Pain Edu? --      Excl. in GC? --     Constitutional: Alert and oriented. Eyes: Conjunctivae are normal. Head: Atraumatic. Nose: No congestion/rhinnorhea. Mouth/Throat: Mucous membranes are moist. Neck: Normal ROM Cardiovascular: Tachycardic, irregularly irregular rhythm. Grossly normal heart sounds. Respiratory:  Normal respiratory effort.  No retractions. Lungs CTAB. Gastrointestinal: Soft and nontender. No distention. Genitourinary: deferred Musculoskeletal: No lower extremity tenderness nor edema. Neurologic:  Normal speech and language. No gross focal neurologic deficits are appreciated. Skin:  Skin is warm, dry and intact. No rash noted. Psychiatric: Mood and affect are normal. Speech and behavior are normal.  ____________________________________________   LABS (all labs ordered are listed, but only abnormal results are displayed)  Labs Reviewed  CBC WITH DIFFERENTIAL/PLATELET - Abnormal; Notable for the following components:      Result Value   RBC 5.35 (*)    RDW 16.0 (*)    All other components within normal limits  BASIC METABOLIC PANEL - Abnormal; Notable for the following components:   Glucose, Bld 161 (*)    BUN 25 (*)    Creatinine, Ser 1.23 (*)    GFR calc non Af Amer 47 (*)    GFR calc Af Amer 54 (*)    All other components within normal limits  MAGNESIUM - Abnormal; Notable for the following components:   Magnesium 1.6 (*)    All other components within normal limits  PROTIME-INR - Abnormal; Notable for the following components:   Prothrombin Time 23.1 (*)    INR 2.1 (*)    All other components within normal limits  TROPONIN I (HIGH SENSITIVITY) - Abnormal; Notable for the following components:   Troponin I (High Sensitivity) 21 (*)    All other components within normal limits  TROPONIN I (HIGH SENSITIVITY) - Abnormal; Notable for the following components:   Troponin I (High Sensitivity) 42 (*)    All other components within normal limits   ____________________________________________  EKG  ED ECG REPORT I, Chesley Noon, the attending physician, personally viewed and interpreted this ECG.   Date: 04/14/2020  EKG Time: 13:12  Rate: 142  Rhythm: atrial fibrillation, rate 142  Axis: Normal  Intervals:none  ST&T Change: None  ED ECG REPORT I, Chesley Noon, the attending physician, personally viewed and interpreted this  ECG.   Date: 04/14/2020  EKG Time: 14:42  Rate: 106  Rhythm: sinus tachycardia  Axis: Normal  Intervals:none  ST&T Change: Subtle ST elevation inferiorly, no reciprocal changes    PROCEDURES  Procedure(s) performed (including Critical Care):  .Critical Care Performed by: Chesley NoonJessup, Deran Barro, MD Authorized by: Chesley NoonJessup, Enid Maultsby, MD   Critical care provider statement:    Critical care time (minutes):  45   Critical care time was exclusive of:  Separately billable procedures and treating other patients and teaching time   Critical care was necessary to treat or prevent imminent or life-threatening deterioration of the following conditions:  Cardiac failure   Critical care was time spent personally by me on the following activities:  Discussions with consultants, evaluation of patient's response to treatment, examination of patient, ordering and performing treatments and interventions, ordering and review of laboratory studies, ordering and review of radiographic studies, pulse oximetry, re-evaluation of patient's condition, obtaining history from patient or surrogate and review of old charts   I assumed direction of critical care for this patient from another provider in my specialty: no   .1-3 Lead EKG Interpretation Performed by: Chesley NoonJessup, Shilo Philipson, MD Authorized by: Chesley NoonJessup, Kartel Wolbert, MD     Interpretation: normal     ECG rate:  128   ECG rate assessment: tachycardic     Rhythm: atrial fibrillation     Ectopy: none     Conduction: normal       ____________________________________________   INITIAL IMPRESSION / ASSESSMENT AND PLAN / ED COURSE       64 year old female with history of A. fib on Coumadin presents to the ED with sharp chest pain since this morning, found to be in atrial fibrillation with RVR when arriving to her PCPs office for COVID-19 vaccination.  She wears 2 L nasal cannula of supplemental  oxygen as needed for her COPD and is breathing comfortably on this, blood pressure remained stable and patient denies any lightheadedness currently.  Unfortunately, she has an allergy listed to diltiazem, we will trial IV pushes of metoprolol for now in order to control her rate.  Plan to check chest x-ray and labs including INR, may be a candidate for cardioversion if INR is within normal limits.  ----------------------------------------- 2:34 PM on 04/14/2020 -----------------------------------------  Patient noted to spontaneously convert to normal sinus rhythm he now states that chest pain has resolved.  Repeat EKG does show subtle ST elevation inferiorly and case was discussed with Dr. Juliann Paresallwood of cardiology.  He states that given chest pain has resolved and EKG changes do not meet STEMI criteria, patient would be appropriate for discharge home if repeat troponin remained stable.  He does recommend increasing her metoprolol dose to 50 mg daily and we will give supplemental 25 mg here in the ED.  ----------------------------------------- 5:00 PM on 04/14/2020 -----------------------------------------  Patient remains in normal sinus rhythm and continues to deny any chest pain.  Repeat troponin slightly increased to 42, again spoke with Dr. Juliann Paresallwood who agrees patient is appropriate for discharge home.  She may follow-up with cardiology on Monday, otherwise counseled to return to the ED for new worsening symptoms.  Patient agrees with plan.       ____________________________________________   FINAL CLINICAL IMPRESSION(S) / ED DIAGNOSES  Final diagnoses:  Atrial fibrillation with RVR (HCC)  Chest pain, unspecified type     ED Discharge Orders         Ordered    metoprolol succinate (TOPROL XL) 50 MG 24 hr tablet  Daily     04/14/20 1659           Note:  This document was prepared using Dragon voice recognition software and may include unintentional dictation errors.     Blake Divine, MD 04/14/20 [phone]

## 2020-04-14 NOTE — ED Notes (Signed)
This RN misunderstood EMS upon pt's arrival to hospital. Pt came from outpt clinic as she was supposed to get one of her covid shots today. Pt reports lives at private home. Pt educated on paperwork. Teach-back method used.

## 2020-04-14 NOTE — ED Triage Notes (Addendum)
Pt in via EMS from [facility]; c/o SVT in 200s/CP/SOB. 22g L hand IV placed by EMS. Adenosine 6mg  then 12mg  of given by EMS without much effect; 160s Afib/Aflutter now per EMS. 2.5 of metoprolol given by EMS. Pt A&Ox4. EMS reports pt back in 200s. EMS accidentally removed IV access when pt transferred from EMS stretcher to [facility] stretcher. 130/80BP; 166BG; 2L home O2.

## 2020-04-14 NOTE — ED Notes (Signed)
Heather RN attaching cardiac monitor to pt. Pads in place with code cart at bedside. Pt's skin dry, resp tachypnic/dyspnic at times. Pt currently denies CP.

## 2020-04-14 NOTE — ED Notes (Signed)
Morrie Sheldon RN stated she will give mag and metoprolol soon.

## 2020-05-01 ENCOUNTER — Other Ambulatory Visit: Payer: Self-pay | Admitting: Family Medicine

## 2020-05-01 DIAGNOSIS — Z1231 Encounter for screening mammogram for malignant neoplasm of breast: Secondary | ICD-10-CM

## 2020-08-23 ENCOUNTER — Other Ambulatory Visit: Payer: Self-pay | Admitting: Family Medicine

## 2020-08-23 DIAGNOSIS — Z0000 Encounter for general adult medical examination without abnormal findings: Secondary | ICD-10-CM

## 2020-09-20 ENCOUNTER — Other Ambulatory Visit: Payer: Medicare Other

## 2020-10-24 ENCOUNTER — Ambulatory Visit
Admission: RE | Admit: 2020-10-24 | Discharge: 2020-10-24 | Disposition: A | Discharge status: Home / Self Care | Payer: Medicare Other | Source: Ambulatory Visit | Attending: Family Medicine | Admitting: Family Medicine

## 2020-10-24 ENCOUNTER — Other Ambulatory Visit: Payer: Self-pay

## 2020-10-24 DIAGNOSIS — E119 Type 2 diabetes mellitus without complications: Secondary | ICD-10-CM | POA: Insufficient documentation

## 2020-10-24 DIAGNOSIS — Z96652 Presence of left artificial knee joint: Secondary | ICD-10-CM | POA: Diagnosis not present

## 2020-10-24 DIAGNOSIS — Z13820 Encounter for screening for osteoporosis: Secondary | ICD-10-CM | POA: Insufficient documentation

## 2020-10-24 DIAGNOSIS — Z780 Asymptomatic menopausal state: Secondary | ICD-10-CM | POA: Insufficient documentation

## 2020-10-24 DIAGNOSIS — Z1231 Encounter for screening mammogram for malignant neoplasm of breast: Secondary | ICD-10-CM

## 2020-10-24 DIAGNOSIS — Z0000 Encounter for general adult medical examination without abnormal findings: Secondary | ICD-10-CM

## 2021-01-22 ENCOUNTER — Other Ambulatory Visit: Payer: Self-pay

## 2021-02-20 ENCOUNTER — Other Ambulatory Visit: Payer: Self-pay

## 2021-02-20 ENCOUNTER — Ambulatory Visit (INDEPENDENT_AMBULATORY_CARE_PROVIDER_SITE_OTHER): Payer: Medicare Other | Admitting: Gastroenterology

## 2021-02-20 VITALS — BP 163/86 | HR 83 | Temp 99.0°F | Ht 72.0 in | Wt 377.0 lb

## 2021-02-20 DIAGNOSIS — R197 Diarrhea, unspecified: Secondary | ICD-10-CM | POA: Diagnosis not present

## 2021-02-20 NOTE — Progress Notes (Signed)
Wyline Mood MD, MRCP(U.K) [address]  [address]  [place], [place] [address]  Main: [phone]  Fax: [phone]   Gastroenterology Consultation  Referring Provider:     Hillery Aldo, MD Primary Care Physician:  Hillery Aldo, MD Primary Gastroenterologist:  Dr. Wyline Mood  Reason for Consultation:   Diarrhea        HPI:   Audrey Hughes is a 65 y.o. y/o female referred for consultation & management  by Dr. Hillery Aldo, MD.    She has been referred for diarrhea seen by Dr. Allena Katz in December 2021 for the same and treated as gastroenteritis.  She is diabetic on Metformin and on Coumadin.  She is here today to see me for diarrhea which she has had for a few months.  Not got any worse.  In fact got a bit better.  She states she has 1-2 bowel movements a day.  Very soft in nature.  At times finds it hard to hold it in.  Does suffer from urinary incontinence as well at times.  Has had diabetes for over 24 years.  She came to see me in a wheelchair.  Last colonoscopy was many years back.  Denies any excess use of artificial sugars or sweeteners.  Does consume some sweet tea daily.  Denies any change in the shape of stool.  Denies any blood in her stool.  Denies any weight loss.  Past Medical History:  Diagnosis Date  . Arthritis   . Asthma   . CAD (coronary artery disease)    s/p MI  . COPD (chronic obstructive pulmonary disease) (HCC)   . Depression   . Diabetes mellitus (HCC)   . DVT (deep venous thrombosis) (HCC)    left thigh 2 months ago  . Dyspnea    severe dyspnea/ possible pickwickian  . Dysrhythmia   . Edema   . GERD (gastroesophageal reflux disease)   . Headache    migraines  . History of kidney stones   . History of orthopnea   . HTN (hypertension)   . Meige's syndrome (blepharospasm with oromandibular dystonia)   . Mobility poor   . Myocardial infarction (HCC)    2016  . Neuropathy   . Obesity   . OSA (obstructive sleep apnea)   . Oxygen deficiency     2l/ hs  . Parkinson disease (HCC)   . Paroxysmal atrial flutter (HCC)   . Tremor   . VIN I (vulvar intraepithelial neoplasia I)     Past Surgical History:  Procedure Laterality Date  . CATARACT EXTRACTION W/PHACO Right 06/25/2018   Procedure: CATARACT EXTRACTION PHACO AND INTRAOCULAR LENS PLACEMENT (IOC);  Surgeon: Nevada Crane, MD;  Location: [facility] ORS;  Service: Ophthalmology;  Laterality: Right;  [place] 00:26.3 AP% 7.1 CDE 1.88 Fluid Pak lot # [id number]  . CATARACT EXTRACTION W/PHACO Left 07/19/2019   Procedure: CATARACT EXTRACTION PHACO AND INTRAOCULAR LENS PLACEMENT (IOC)  LEFT DIABETIC;  Surgeon: Nevada Crane, MD;  Location: [facility];  Service: Ophthalmology;  Laterality: Left;  Diabetic - insulin and oal meds  . CESAREAN SECTION    . CHOLECYSTECTOMY    . JOINT REPLACEMENT    . KIDNEY STONE SURGERY    . LAPAROSCOPIC TUBAL LIGATION    . TONSILLECTOMY AND ADENOIDECTOMY    . TUBAL LIGATION      Prior to Admission medications   Medication Sig Start Date End Date Taking? Authorizing Provider  acetaminophen (TYLENOL) 500 MG tablet Take 500  mg by mouth every 6 (six) hours as needed for moderate pain.    [provider]  albuterol (PROVENTIL) (2.5 MG/3ML) 0.083% nebulizer solution Take 2.5 mg by nebulization every 6 (six) hours as needed for wheezing or shortness of breath.    [provider]  aspirin 81 MG tablet Take 81 mg by mouth daily.    [provider]  Dulaglutide (TRULICITY) 0.75 MG/0.5ML SOPN Inject into the skin once a week. Fridays    [provider]  ferrous sulfate 325 (65 FE) MG tablet Take 325 mg by mouth daily with breakfast.    [provider]  gabapentin (NEURONTIN) 300 MG capsule Take 300 mg by mouth 2 (two) times daily.    [provider]  HYDROcodone-acetaminophen (NORCO) 10-325 MG per tablet Take 1 tablet by mouth 3 (three) times daily.     [provider]  indapamide (LOZOL) 2.5 MG  tablet Take 2.5 mg by mouth daily.     [provider]  insulin regular human CONCENTRATED (HUMULIN R U-500 KWIKPEN) 500 UNIT/ML kwikpen Inject 105-110 Units into the skin See admin instructions. Inject 110 units in the morning and 105 units in the evening (06/29/19  Pt reports 95 units bid)    [provider]  metFORMIN (GLUCOPHAGE) 500 MG tablet Take 500 mg by mouth 2 (two) times daily with a meal.     [provider]  metoprolol succinate (TOPROL XL) 50 MG 24 hr tablet Take 1 tablet (50 mg total) by mouth daily. Take with or immediately following a meal. 04/14/20 06/13/20  Chesley Noon, MD  Multiple Vitamins-Minerals (WOMENS 50+ MULTI VITAMIN/MIN PO) Take 1 tablet by mouth daily.    [provider]  nystatin cream (MYCOSTATIN) Apply 1 application topically once a week.     [provider]  OXYGEN Inhale 2 L into the lungs at bedtime. (Also uses when "hot")    [provider]  pantoprazole (PROTONIX) 40 MG tablet Take 40 mg by mouth daily.    [provider]  PARoxetine (PAXIL) 40 MG tablet Take 40 mg by mouth daily.     [provider]  quinapril (ACCUPRIL) 10 MG tablet Take 10 mg by mouth daily.     [provider]  rosuvastatin (CRESTOR) 10 MG tablet Take 10 mg by mouth every evening.     [provider]  warfarin (COUMADIN) 5 MG tablet Take 5 mg by mouth at bedtime.     [provider]    Family History  Problem Relation Age of Onset  . Diabetes Mother   . Heart disease Mother   . COPD Father   . Hypertension Father   . Breast cancer Neg Hx      Social History   Tobacco Use  . Smoking status: Never Smoker  . Smokeless tobacco: Never Used  Vaping Use  . Vaping Use: Never used  Substance Use Topics  . Alcohol use: No    Alcohol/week: 0.0 standard drinks    Comment: 1 can of beer  . Drug use: No    Allergies as of 02/20/2021 - Review Complete 02/20/2021  Allergen Reaction Noted   . Ketorolac Hives 02/05/2015  . Diltiazem Hives 07/08/2015  . Flagyl [metronidazole] Hives 07/08/2015  . Sulfa antibiotics Hives 07/08/2015  . Toradol [ketorolac tromethamine] Hives 07/08/2015    Review of Systems:    All systems reviewed and negative except where noted in HPI.   Physical Exam:  BP (!) 163/86  Pulse 83   Temp 99 F (37.2 C)   Ht 6' (1.829 m)   Wt (!) 377 lb (171 kg)   BMI 51.13 kg/m  No LMP recorded. Patient is postmenopausal. Psych:  Alert and cooperative. Normal mood and affect. General:   Alert,  Well-developed, well-nourished, pleasant and cooperative in NAD Head:  Normocephalic and atraumatic. Eyes:  Sclera clear, no icterus.   Conjunctiva pink. Lungs:  Respirations even and unlabored.  Clear throughout to auscultation.   No wheezes, crackles, or rhonchi. No acute distress. Heart:  Regular rate and rhythm; no murmurs, clicks, rubs, or gallops. Abdomen:  Normal bowel sounds.  No bruits.  Soft, non-tender and non-distended without masses, hepatosplenomegaly or hernias noted.  No guarding or rebound tenderness.    Neurologic:  Alert and oriented x3;  grossly normal neurologically.  Psych:  Alert and cooperative. Normal mood and affect.  Imaging Studies: No results found.  Assessment and Plan:   Audrey Hughes is a 65 y.o. y/o female has been referred for diarrhea which is chronic and ongoing for the past few months.  She also has fecal incontinence with urinary incontinence very likely secondary to possible neuropathy which could be from diabetes.  No recent colonoscopy.  Plan 1.  Stop all artificial sugars, low FODMAP diet, stool studies 2.  Colonoscopy to rule out microscopic colitis 3.  We will perform rectal exam to evaluate anal tone during your procedure 4.  If evaluation is negative could consider holding Metformin as a trial to see if it is drug-induced. 5.  Explained to her due to her BMI the procedure would be high risk.  I have discussed  alternative options, risks & benefits,  which include, but are not limited to, bleeding, infection, perforation,respiratory complication & drug reaction.  The patient agrees with this plan & written consent will be obtained.     Follow up in 10 weeks   Dr Wyline Mood MD,MRCP(U.K)

## 2021-02-23 ENCOUNTER — Telehealth: Payer: Self-pay | Admitting: Gastroenterology

## 2021-02-23 NOTE — Telephone Encounter (Signed)
Tresa Endo from [facility] called to let you know that they cannot clear patient for EGD by stopping her Warfarin since she has not had her labs checked.  Tresa Endo can be reached at [phone]

## 2021-02-26 ENCOUNTER — Telehealth: Payer: Self-pay

## 2021-02-26 NOTE — Telephone Encounter (Signed)
Cardiac clearance has been denied per Tresa Endo at Dr. Philemon Kingdom office.  Pt has not been compliant with having her lab work performed as requested.  This message has been communicated to [place], CMA to Dr. Tobi Bastos.  Thanks,  [place], [place]

## 2021-02-27 NOTE — Telephone Encounter (Signed)
Called pt to speak with pt regarding her cardiac clearance. Unable to contact, LVM to return call

## 2021-03-07 IMAGING — MG DIGITAL SCREENING BILAT W/ TOMO W/ CAD
8 of 15 series · 8 of 40 positions shown · non-contrast
Comparison: Previous exam(s).

CLINICAL DATA: Screening.

EXAM:
DIGITAL SCREENING BILATERAL MAMMOGRAM WITH TOMO AND CAD

[R MLO synth-2D (1 of 2)]
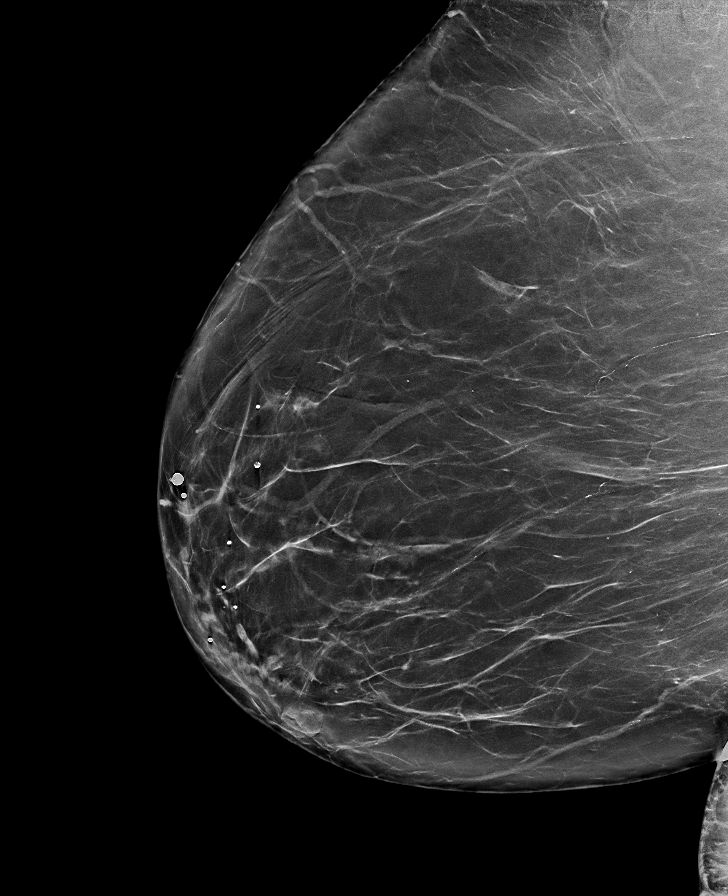

[L MLO synth-2D]
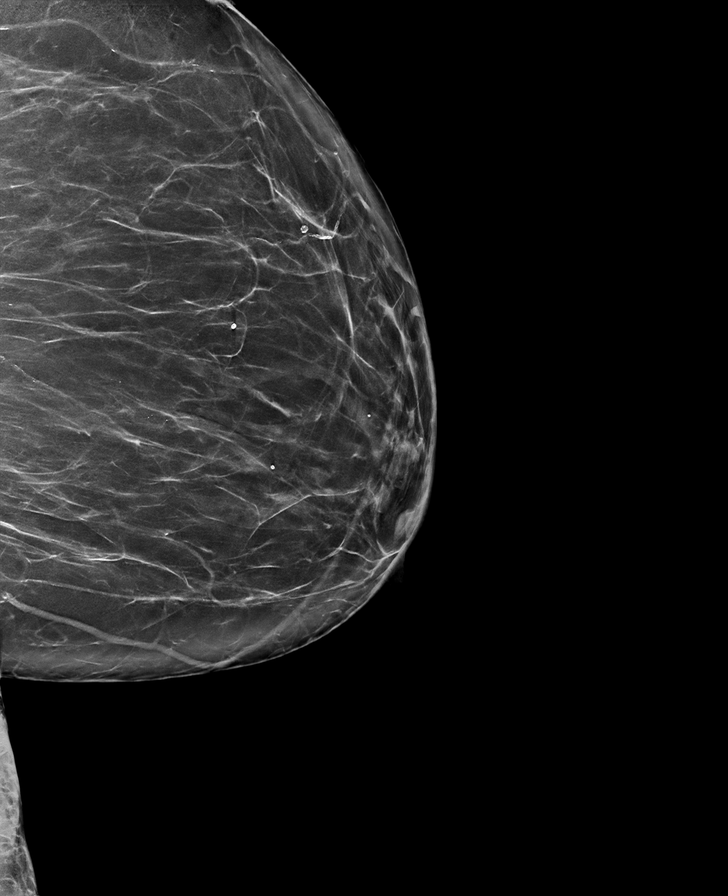

[R MLO synth-2D (2 of 2)]
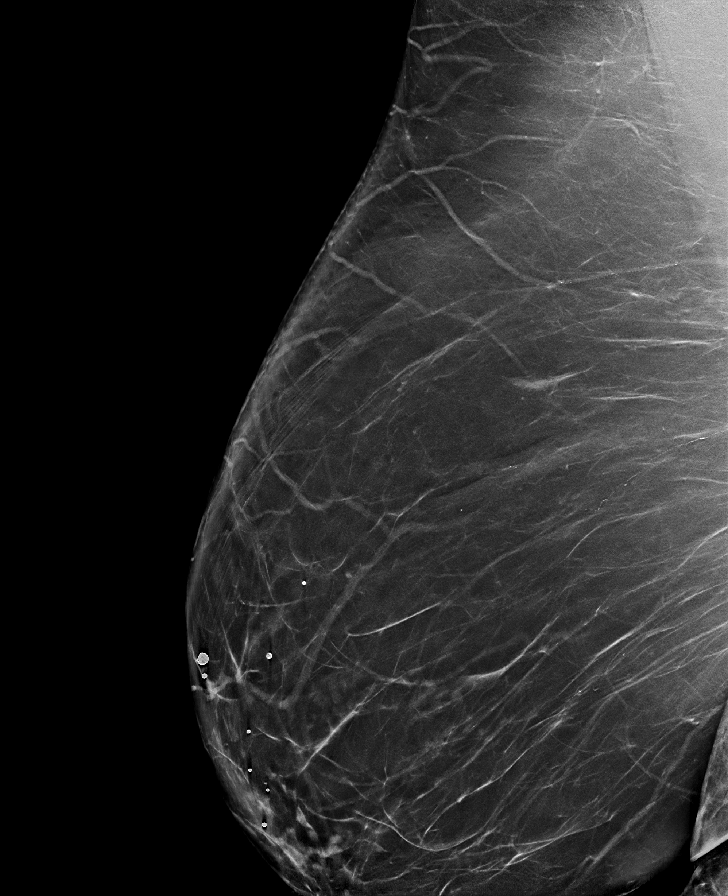

[L CC synth-2D]
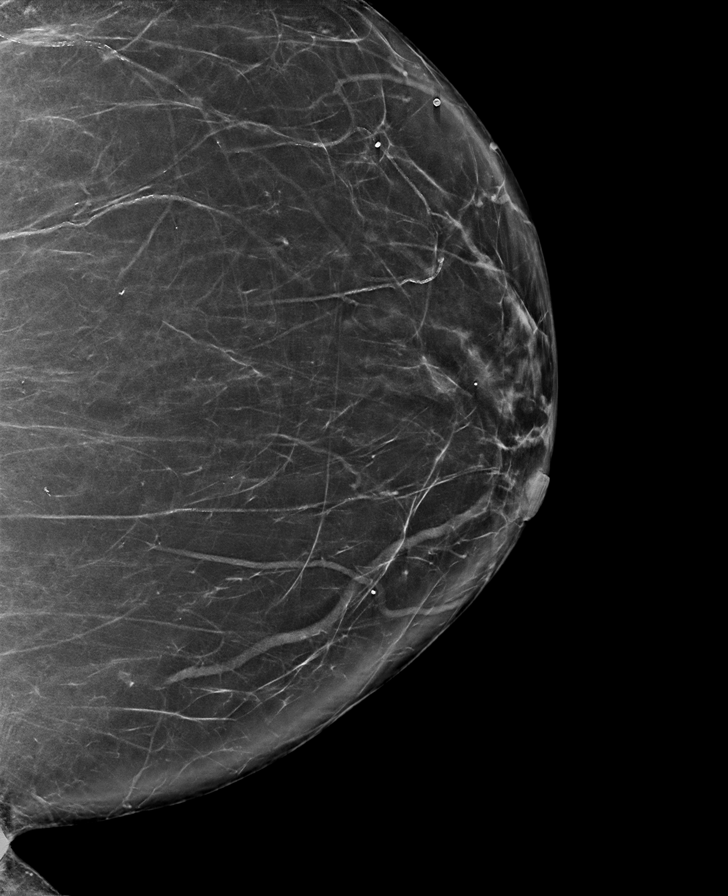

[R XCCL synth-2D]
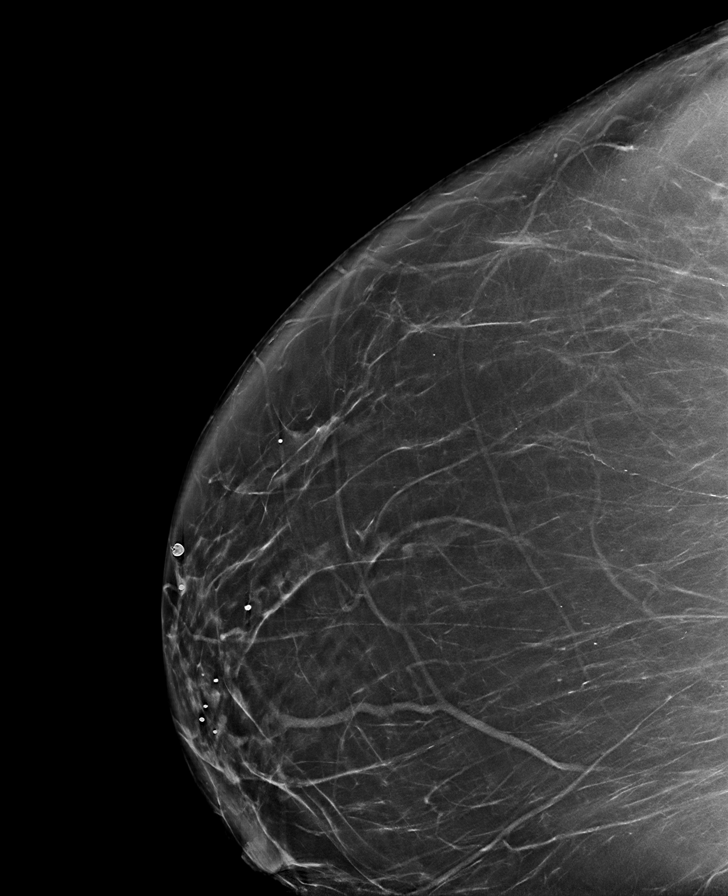

[L XCCL synth-2D]
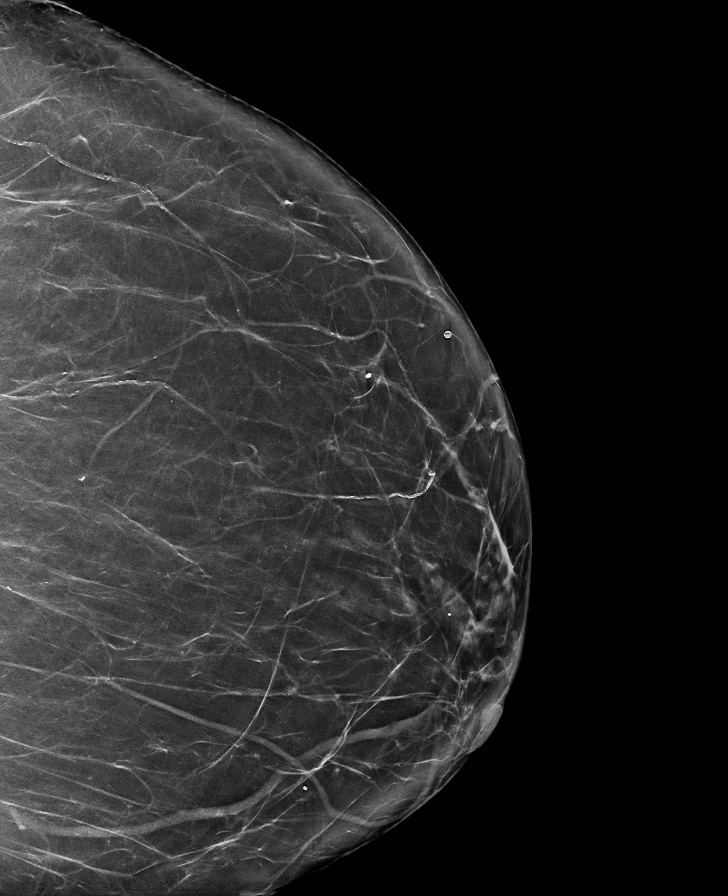

[R CC synth-2D]
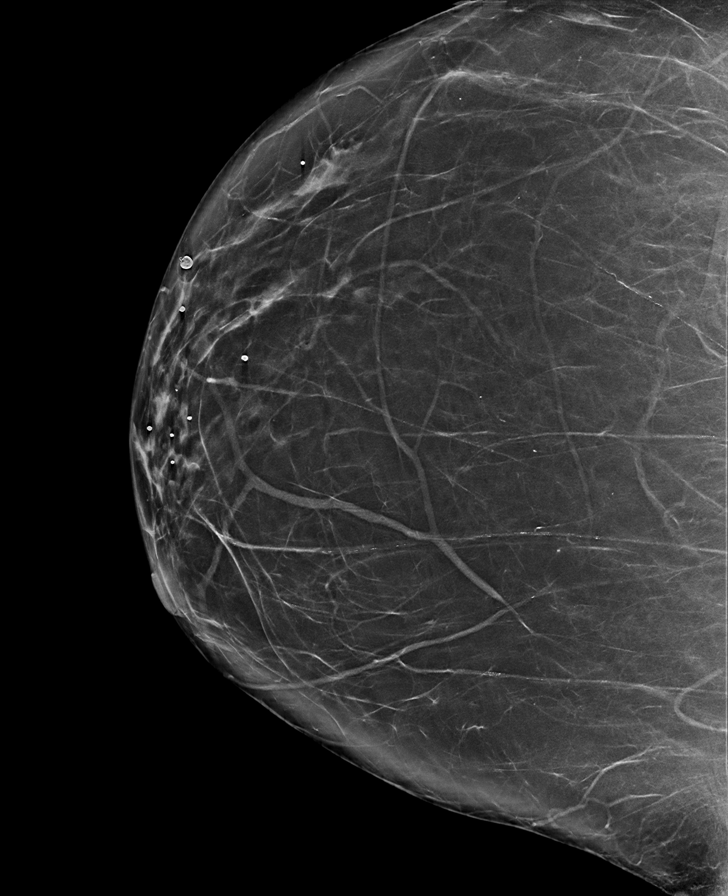

[L XCCL tomo · tomo slice 54/79.0]
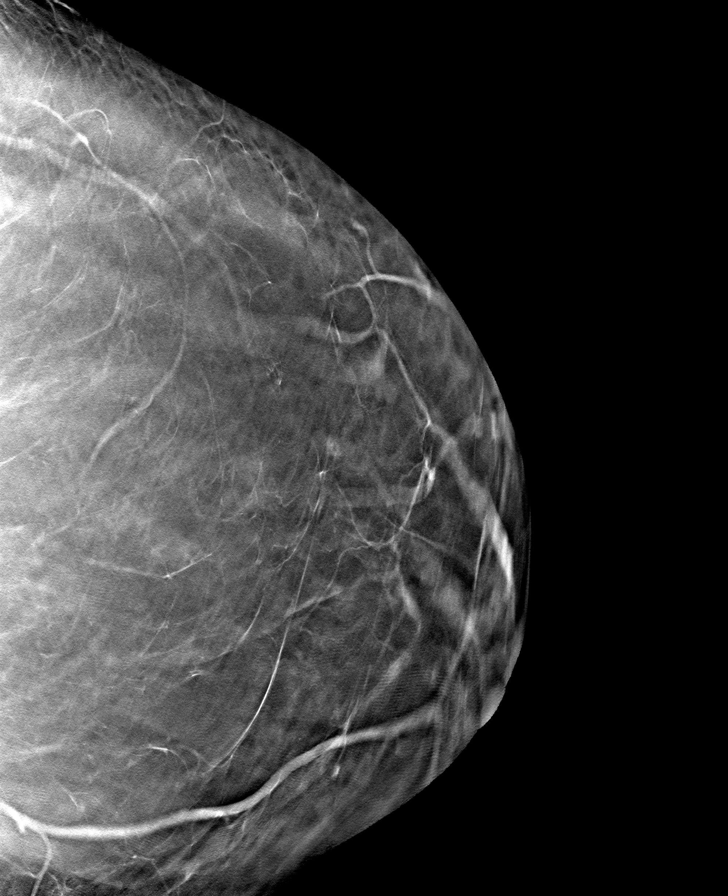

[8 of 40 positions shown; findings below may reference images not displayed]

ACR Breast Density Category b: There are scattered areas of
fibroglandular density.
FINDINGS: There are no findings suspicious for malignancy. Evaluation of the
far posterior and axillary tissue is limited secondary to patient
inability to be optimally positioned. Images were processed with
CAD.
IMPRESSION: No mammographic evidence of malignancy. A result letter of this
screening mammogram will be mailed directly to the patient.

RECOMMENDATION:
Screening mammogram in one year. (Code:WW-S-K38)

BI-RADS CATEGORY  1: Negative.

## 2021-03-16 ENCOUNTER — Other Ambulatory Visit: Admission: RE | Admit: 2021-03-16 | Payer: Medicare Other | Source: Ambulatory Visit

## 2021-03-20 ENCOUNTER — Ambulatory Visit: Admission: RE | Admit: 2021-03-20 | Payer: Medicare Other | Source: Home / Self Care | Admitting: Gastroenterology

## 2021-03-20 ENCOUNTER — Encounter: Admission: RE | Payer: Self-pay | Source: Home / Self Care

## 2021-03-20 SURGERY — COLONOSCOPY WITH PROPOFOL
Anesthesia: General

## 2021-04-18 ENCOUNTER — Other Ambulatory Visit: Payer: Self-pay

## 2021-04-18 ENCOUNTER — Encounter: Payer: Self-pay | Admitting: Gastroenterology

## 2021-04-18 ENCOUNTER — Ambulatory Visit (INDEPENDENT_AMBULATORY_CARE_PROVIDER_SITE_OTHER): Payer: Medicare Other | Admitting: Gastroenterology

## 2021-04-18 VITALS — BP 169/93 | HR 73 | Ht 72.0 in | Wt 371.0 lb

## 2021-04-18 DIAGNOSIS — R197 Diarrhea, unspecified: Secondary | ICD-10-CM

## 2021-04-18 NOTE — Progress Notes (Signed)
Wyline Mood MD, MRCP(U.K) [address]  [address]  [place], [place] [address]  Main: [phone]  Fax: [phone]   Primary Care Physician: Hillery Aldo, MD  Primary Gastroenterologist:  Dr. Wyline Mood   Diarrhea follow-up  HPI: Audrey Hughes is a 65 y.o. female    Summary of history :  Initially referred and seen on 02/20/2021 for diarrhea.  She is on metformin and Coumadin.  Diarrhea ongoing for few months.  History suggestive of degree of incontinence.  Diabetes for over 24 years.  Also suffers from urinary incontinence at the same time.  Last colonoscopy was many years back.  Did consume some quantity of sweet tea but no excess artificial sugars.  Interval history   02/20/2021-04/18/2021  We could not obtain cardiac clearance for a colonoscopy as it was denied as the patient was not compliant with having the lab work performed as requested. Stool test on 02/20/2021 was also not performed as requested  She is here today with her aide.  Continue to have diarrhea and she states it is only 1 bowel movement that she has per day but it is diarrhea.   Current Outpatient Medications  Medication Sig Dispense Refill  . acetaminophen (TYLENOL) 500 MG tablet Take 500 mg by mouth every 6 (six) hours as needed for moderate pain.    Marland Kitchen albuterol (PROVENTIL) (2.5 MG/3ML) 0.083% nebulizer solution Take 2.5 mg by nebulization every 6 (six) hours as needed for wheezing or shortness of breath.    Marland Kitchen aspirin 81 MG tablet Take 81 mg by mouth daily.    . Dulaglutide (TRULICITY) 0.75 MG/0.5ML SOPN Inject into the skin once a week. Fridays    . gabapentin (NEURONTIN) 300 MG capsule Take 300 mg by mouth 2 (two) times daily.    Marland Kitchen HYDROcodone-acetaminophen (NORCO) 10-325 MG per tablet Take 1 tablet by mouth 3 (three) times daily.    . indapamide (LOZOL) 2.5 MG tablet Take 2.5 mg by mouth daily.     . insulin regular human CONCENTRATED (HUMULIN R) 500 UNIT/ML kwikpen Inject 105-110 Units into the  skin See admin instructions. Inject 110 units in the morning and 105 units in the evening (06/29/19  Pt reports 95 units bid)    . metFORMIN (GLUCOPHAGE) 500 MG tablet Take 500 mg by mouth 2 (two) times daily with a meal.     . Multiple Vitamins-Minerals (WOMENS 50+ MULTI VITAMIN/MIN PO) Take 1 tablet by mouth daily.    Marland Kitchen nystatin cream (MYCOSTATIN) Apply 1 application topically once a week.     . OXYGEN Inhale 2 L into the lungs at bedtime. (Also uses when "hot")    . pantoprazole (PROTONIX) 40 MG tablet Take 40 mg by mouth daily.    Marland Kitchen PARoxetine (PAXIL) 40 MG tablet Take 40 mg by mouth daily.     . quinapril (ACCUPRIL) 10 MG tablet Take 10 mg by mouth daily.     . rosuvastatin (CRESTOR) 10 MG tablet Take 10 mg by mouth every evening.     . warfarin (COUMADIN) 5 MG tablet Take 5 mg by mouth at bedtime.     . ferrous sulfate 325 (65 FE) MG tablet Take 325 mg by mouth daily with breakfast.    . metoprolol succinate (TOPROL XL) 50 MG 24 hr tablet Take 1 tablet (50 mg total) by mouth daily. Take with or immediately following a meal. 30 tablet 1   No current facility-administered medications for this visit.    Allergies as  of 04/18/2021 - Review Complete 04/18/2021  Allergen Reaction Noted  . Ketorolac Hives 02/05/2015  . Diltiazem Hives 07/08/2015  . Flagyl [metronidazole] Hives 07/08/2015  . Sulfa antibiotics Hives 07/08/2015  . Toradol [ketorolac tromethamine] Hives 07/08/2015    ROS:  General: Negative for anorexia, weight loss, fever, chills, fatigue, weakness. ENT: Negative for hoarseness, difficulty swallowing , nasal congestion. CV: Negative for chest pain, angina, palpitations, dyspnea on exertion, peripheral edema.  Respiratory: Negative for dyspnea at rest, dyspnea on exertion, cough, sputum, wheezing.  GI: See history of present illness. GU:  Negative for dysuria, hematuria, urinary incontinence, urinary frequency, nocturnal urination.  Endo: Negative for unusual weight  change.    Physical Examination:   BP (!) 169/93 (BP Location: Right Wrist, Patient Position: Sitting, Cuff Size: Small)   Pulse 73   Ht 6' (1.829 m)   Wt (!) 371 lb (168.3 kg)   BMI 50.32 kg/m   General: Well-nourished, well-developed in no acute distress.  Eyes: No icterus. Conjunctivae pink. Neuro: Alert and oriented x 3.  Grossly intact. Skin: Warm and dry, no jaundice.   Psych: Alert and cooperative, normal mood and affect.   Imaging Studies: No results found.  Assessment and Plan:   Audrey Hughes is a 65 y.o. y/o female here to follow-up for diarrhea initially seen in February 2022.  She has been on metformin and the diarrhea has been going on for a few months.  Plan was to perform a colonoscopy, could not obtain cardiac clearance as she was not compliant with requested blood work.  No stool tests were obtained despite ordering them. Explained the need to follow-up with a cardiologist for clearance as well as to obtain the stool test.  I will give her a call in 6 weeks with a telephone visit to just check if all the above have been done before we schedule a colonoscopy.   Dr Wyline Mood  MD,MRCP [facility]) Follow up in 6 weeks telephone visit

## 2021-04-18 NOTE — Patient Instructions (Signed)
Patient needs to follow up with her cardiologist before colonoscopy can be scheduled. Patient will call us after her cardiology visit.

## 2021-04-20 ENCOUNTER — Other Ambulatory Visit: Payer: Self-pay

## 2021-05-06 LAB — GI PROFILE, STOOL, PCR

## 2021-05-07 ENCOUNTER — Telehealth: Payer: Self-pay

## 2021-05-07 DIAGNOSIS — R197 Diarrhea, unspecified: Secondary | ICD-10-CM

## 2021-05-07 NOTE — Telephone Encounter (Signed)
Patient says her lab specimen failed. Wants to know if she should re-test. Says lab told her the she waited too long to bring the sample in. Please advise

## 2021-05-08 NOTE — Telephone Encounter (Signed)
Sent patient a my chart message explaining she would have to re-do those labs. Orders will be placed.

## 2021-05-09 LAB — C DIFFICILE TOXINS A+B W/RFLX: C difficile Toxins A+B, EIA: NEGATIVE

## 2021-05-09 LAB — C DIFFICILE, CYTOTOXIN B

## 2021-06-13 ENCOUNTER — Ambulatory Visit: Payer: Medicare Other | Admitting: Gastroenterology

## 2022-12-03 ENCOUNTER — Other Ambulatory Visit: Payer: Self-pay

## 2022-12-03 DIAGNOSIS — E1122 Type 2 diabetes mellitus with diabetic chronic kidney disease: Secondary | ICD-10-CM

## 2022-12-03 DIAGNOSIS — N1832 Chronic kidney disease, stage 3b: Secondary | ICD-10-CM

## 2022-12-11 ENCOUNTER — Ambulatory Visit
Admission: RE | Admit: 2022-12-11 | Discharge: 2022-12-11 | Disposition: A | Discharge status: Home / Self Care | Payer: Medicare Other | Source: Ambulatory Visit | Attending: Nephrology | Admitting: Nephrology

## 2022-12-11 DIAGNOSIS — N1832 Chronic kidney disease, stage 3b: Secondary | ICD-10-CM | POA: Diagnosis not present

## 2022-12-11 DIAGNOSIS — E1122 Type 2 diabetes mellitus with diabetic chronic kidney disease: Secondary | ICD-10-CM | POA: Diagnosis present

## 2023-02-21 ENCOUNTER — Other Ambulatory Visit: Payer: Self-pay | Admitting: Family Medicine

## 2023-02-21 DIAGNOSIS — Z1231 Encounter for screening mammogram for malignant neoplasm of breast: Secondary | ICD-10-CM

## 2023-04-14 ENCOUNTER — Ambulatory Visit
Admission: RE | Admit: 2023-04-14 | Discharge: 2023-04-14 | Disposition: A | Discharge status: Home / Self Care | Payer: 59 | Source: Ambulatory Visit | Attending: Family Medicine | Admitting: Family Medicine

## 2023-04-14 DIAGNOSIS — Z1231 Encounter for screening mammogram for malignant neoplasm of breast: Secondary | ICD-10-CM | POA: Diagnosis present

## 2023-06-05 ENCOUNTER — Encounter: Payer: Self-pay | Admitting: Gastroenterology

## 2023-06-05 NOTE — H&P (Signed)
Pre-Procedure H&P   Patient ID: Audrey Hughes is a 67 y.o. female.  Gastroenterology Provider: [name], DO  Referring Provider: Fransico Setters, NP PCP: Hillery Aldo, MD  Date: 06/06/2023  HPI Ms. Audrey Hughes is a 67 y.o. female who presents today for Esophagogastroduodenoscopy and Colonoscopy for Dysphagia, GERD, diarrhea, FOBT positive .  Patient has had worsening diarrheas for years.  She previously followed with [facility] with negative workup.  She reports 1-2 loose bowel movements per day with her diarrhea.  No melena or hematochezia. FOBT+ Patient currently weighs 345 pounds.   Dysphagia to solid foods.  She is on Eliquis and Trulicity which have been held for this procedure (last dose over the weekend and two weeks ago respectively). Patient requires nasal cannula with supplemental O2 overnight  No family history colorectal cancer or colon polyps Hemoglobin 12.5 MCV 86 platelets 258,000 Status post cholecystectomy Edentulous as of 2023 She has been cleared by her cardiology provider for the procedure and sedation   Past Medical History:  Diagnosis Date   Arthritis    Asthma    CAD (coronary artery disease)    s/p MI   COPD (chronic obstructive pulmonary disease) (HCC)    Depression    Diabetes mellitus (HCC)    DVT (deep venous thrombosis) (HCC)    left thigh 2 months ago   Dyspnea    severe dyspnea/ possible pickwickian   Dysrhythmia    Edema    GERD (gastroesophageal reflux disease)    Headache    migraines   History of kidney stones    History of orthopnea    HTN (hypertension)    Meige's syndrome (blepharospasm with oromandibular dystonia)    Mobility poor    Myocardial infarction (HCC)    2016   Neuropathy    Obesity    OSA (obstructive sleep apnea)    Oxygen deficiency    2l/ hs   Parkinson disease    Paroxysmal atrial flutter (HCC)    Tremor    VIN I (vulvar intraepithelial neoplasia I)     Past Surgical History:  Procedure  Laterality Date   CATARACT EXTRACTION W/PHACO Right 06/25/2018   Procedure: CATARACT EXTRACTION PHACO AND INTRAOCULAR LENS PLACEMENT (IOC);  Surgeon: Nevada Crane, MD;  Location: [facility] ORS;  Service: Ophthalmology;  Laterality: Right;  [place] 00:26.3 AP% 7.1 CDE 1.88 Fluid Pak lot # [id number]   CATARACT EXTRACTION W/PHACO Left 07/19/2019   Procedure: CATARACT EXTRACTION PHACO AND INTRAOCULAR LENS PLACEMENT (IOC)  LEFT DIABETIC;  Surgeon: Nevada Crane, MD;  Location: [facility];  Service: Ophthalmology;  Laterality: Left;  Diabetic - insulin and oal meds   CESAREAN SECTION     CHOLECYSTECTOMY     EYE SURGERY     JOINT REPLACEMENT     KIDNEY STONE SURGERY     LAPAROSCOPIC TUBAL LIGATION     TONSILLECTOMY AND ADENOIDECTOMY     TUBAL LIGATION      Family History No h/o GI disease or malignancy  Review of Systems  Constitutional:  Negative for activity change, appetite change, chills, diaphoresis, fatigue, fever and unexpected weight change.  HENT:  Positive for trouble swallowing. Negative for voice change.   Respiratory:  Negative for shortness of breath and wheezing.   Cardiovascular:  Negative for chest pain, palpitations and leg swelling.  Gastrointestinal:  Positive for diarrhea. Negative for abdominal distention, abdominal pain, anal bleeding, blood in stool, constipation, nausea, rectal pain and vomiting.  Musculoskeletal:  Negative for arthralgias and myalgias.  Skin:  Negative for color change and pallor.  Neurological:  Positive for tremors. Negative for dizziness, syncope and weakness.  Psychiatric/Behavioral:  Negative for confusion.   All other systems reviewed and are negative.    Medications No current facility-administered medications on file prior to encounter.   Current Outpatient Medications on File Prior to Encounter  Medication Sig Dispense Refill   aspirin 81 MG tablet Take 81 mg by mouth daily.     ferrous sulfate 325 (65 FE) MG tablet Take  325 mg by mouth daily with breakfast.     gabapentin (NEURONTIN) 300 MG capsule Take 300 mg by mouth 2 (two) times daily.     HYDROcodone-acetaminophen (NORCO) 10-325 MG per tablet Take 1 tablet by mouth 3 (three) times daily.     indapamide (LOZOL) 2.5 MG tablet Take 2.5 mg by mouth daily.      insulin regular human CONCENTRATED (HUMULIN R) 500 UNIT/ML kwikpen Inject 105-110 Units into the skin See admin instructions. Inject 110 units in the morning and 105 units in the evening (06/29/19  Pt reports 95 units bid)     metFORMIN (GLUCOPHAGE) 500 MG tablet Take 500 mg by mouth 2 (two) times daily with a meal.      Multiple Vitamins-Minerals (WOMENS 50+ MULTI VITAMIN/MIN PO) Take 1 tablet by mouth daily.     nystatin cream (MYCOSTATIN) Apply 1 application topically once a week.      OXYGEN Inhale 2 L into the lungs at bedtime. (Also uses when "hot")     pantoprazole (PROTONIX) 40 MG tablet Take 40 mg by mouth daily.     PARoxetine (PAXIL) 40 MG tablet Take 40 mg by mouth daily.      rosuvastatin (CRESTOR) 10 MG tablet Take 10 mg by mouth every evening.      acetaminophen (TYLENOL) 500 MG tablet Take 500 mg by mouth every 6 (six) hours as needed for moderate pain.     albuterol (PROVENTIL) (2.5 MG/3ML) 0.083% nebulizer solution Take 2.5 mg by nebulization every 6 (six) hours as needed for wheezing or shortness of breath.     Dulaglutide (TRULICITY) 0.75 MG/0.5ML SOPN Inject into the skin once a week. Fridays     metoprolol succinate (TOPROL XL) 50 MG 24 hr tablet Take 1 tablet (50 mg total) by mouth daily. Take with or immediately following a meal. 30 tablet 1   quinapril (ACCUPRIL) 10 MG tablet Take 10 mg by mouth daily.  (Patient not taking: Reported on 06/06/2023)     warfarin (COUMADIN) 5 MG tablet Take 5 mg by mouth at bedtime.  (Patient not taking: Reported on 06/06/2023)      Pertinent medications related to GI and procedure were reviewed by me with the patient prior to the procedure  No current  facility-administered medications for this encounter.  Facility-Administered Medications Ordered in Other Encounters:    0.9 %  sodium chloride infusion, , Intravenous, Continuous PRN, Fletcher-Harrison, Tawana, CRNA, New Bag at 06/06/23 [phone]      Allergies  Allergen Reactions   Ketorolac Hives   Diltiazem Hives   Flagyl [Metronidazole] Hives   Sulfa Antibiotics Hives   Toradol [Ketorolac Tromethamine] Hives   Allergies were reviewed by me prior to the procedure  Objective   Body mass index is 33.5 kg/m. Vitals:   06/06/23 0919  BP: (!) 153/62  Pulse: 71  Resp: 20  Temp: (!) 97.3 F (36.3 C)  TempSrc: Temporal  SpO2: 98%  Weight: 112 kg  Height: 6' (1.829 m)     Physical Exam Vitals and nursing note reviewed.  Constitutional:      General: She is not in acute distress.    Appearance: Normal appearance. She is obese. She is not ill-appearing, toxic-appearing or diaphoretic.  HENT:     Head: Normocephalic and atraumatic.     Nose: Nose normal.     Mouth/Throat:     Mouth: Mucous membranes are moist.     Pharynx: Oropharynx is clear.     Comments: Edentulous Eyes:     General: No scleral icterus.    Extraocular Movements: Extraocular movements intact.  Cardiovascular:     Rate and Rhythm: Normal rate and regular rhythm.     Heart sounds: Normal heart sounds. No murmur heard.    No friction rub. No gallop.  Pulmonary:     Effort: Pulmonary effort is normal. No respiratory distress.     Breath sounds: Normal breath sounds. No wheezing, rhonchi or rales.  Abdominal:     General: Bowel sounds are normal. There is no distension.     Palpations: Abdomen is soft.     Tenderness: There is no abdominal tenderness. There is no guarding or rebound.  Musculoskeletal:     Cervical back: Neck supple.     Right lower leg: No edema.     Left lower leg: No edema.  Skin:    General: Skin is warm and dry.     Coloration: Skin is not jaundiced or pale.  Neurological:      Mental Status: She is alert and oriented to person, place, and time. Mental status is at baseline.     Comments: Parkinsons related tremor  Psychiatric:        Mood and Affect: Mood normal.        Behavior: Behavior normal.        Thought Content: Thought content normal.        Judgment: Judgment normal.      Assessment:  Ms. Audrey Hughes is a 67 y.o. female  who presents today for Esophagogastroduodenoscopy and Colonoscopy for Dysphagia, GERD, diarrhea, FOBT positive .  Plan:  Esophagogastroduodenoscopy and Colonoscopy with possible intervention today  Esophagogastroduodenoscopy and Colonoscopy with possible biopsy, control of bleeding, polypectomy, and interventions as necessary has been discussed with the patient/patient representative. Informed consent was obtained from the patient/patient representative after explaining the indication, nature, and risks of the procedure including but not limited to death, bleeding, perforation, missed neoplasm/lesions, cardiorespiratory compromise, and reaction to medications. Opportunity for questions was given and appropriate answers were provided. Patient/patient representative has verbalized understanding is amenable to undergoing the procedure.   [name], DO  [facility]  Portions of the record may have been created with voice recognition software. Occasional wrong-word or 'sound-a-like' substitutions may have occurred due to the inherent limitations of voice recognition software.  Read the chart carefully and recognize, using context, where substitutions may have occurred.

## 2023-06-06 ENCOUNTER — Encounter
Admission: RE | Disposition: A | Discharge status: Home / Self Care | Payer: Self-pay | Source: Home / Self Care | Attending: Gastroenterology

## 2023-06-06 ENCOUNTER — Ambulatory Visit: Payer: 59 | Admitting: Certified Registered"

## 2023-06-06 ENCOUNTER — Encounter: Payer: Self-pay | Admitting: Gastroenterology

## 2023-06-06 ENCOUNTER — Ambulatory Visit
Admission: RE | Admit: 2023-06-06 | Discharge: 2023-06-06 | Disposition: A | Discharge status: Home / Self Care | Payer: 59 | Attending: Gastroenterology | Admitting: Gastroenterology

## 2023-06-06 DIAGNOSIS — R1310 Dysphagia, unspecified: Secondary | ICD-10-CM | POA: Diagnosis not present

## 2023-06-06 DIAGNOSIS — Z1211 Encounter for screening for malignant neoplasm of colon: Secondary | ICD-10-CM | POA: Diagnosis not present

## 2023-06-06 DIAGNOSIS — Z7985 Long-term (current) use of injectable non-insulin antidiabetic drugs: Secondary | ICD-10-CM | POA: Diagnosis not present

## 2023-06-06 DIAGNOSIS — K317 Polyp of stomach and duodenum: Secondary | ICD-10-CM | POA: Insufficient documentation

## 2023-06-06 DIAGNOSIS — Z7901 Long term (current) use of anticoagulants: Secondary | ICD-10-CM | POA: Insufficient documentation

## 2023-06-06 DIAGNOSIS — K621 Rectal polyp: Secondary | ICD-10-CM | POA: Diagnosis not present

## 2023-06-06 DIAGNOSIS — D123 Benign neoplasm of transverse colon: Secondary | ICD-10-CM | POA: Insufficient documentation

## 2023-06-06 DIAGNOSIS — Z9049 Acquired absence of other specified parts of digestive tract: Secondary | ICD-10-CM | POA: Insufficient documentation

## 2023-06-06 DIAGNOSIS — K224 Dyskinesia of esophagus: Secondary | ICD-10-CM | POA: Diagnosis not present

## 2023-06-06 DIAGNOSIS — K219 Gastro-esophageal reflux disease without esophagitis: Secondary | ICD-10-CM | POA: Diagnosis not present

## 2023-06-06 DIAGNOSIS — K5730 Diverticulosis of large intestine without perforation or abscess without bleeding: Secondary | ICD-10-CM | POA: Insufficient documentation

## 2023-06-06 HISTORY — PX: ESOPHAGOGASTRODUODENOSCOPY: SHX5428

## 2023-06-06 HISTORY — PX: COLONOSCOPY: SHX5424

## 2023-06-06 LAB — GLUCOSE, CAPILLARY: Glucose-Capillary: 148 mg/dL — ABNORMAL HIGH (ref 70–99)

## 2023-06-06 SURGERY — COLONOSCOPY
Anesthesia: General

## 2023-06-06 MED ORDER — SODIUM CHLORIDE 0.9 % IV SOLN
INTRAVENOUS | Status: DC | PRN
  Administered 2023-06-06: 1000 mL via INTRAVENOUS

## 2023-06-06 MED ORDER — PROPOFOL 500 MG/50ML IV EMUL
INTRAVENOUS | Status: DC | PRN
  Administered 2023-06-06: 145 ug/kg/min via INTRAVENOUS

## 2023-06-06 MED ORDER — LIDOCAINE HCL (CARDIAC) PF 100 MG/5ML IV SOSY
PREFILLED_SYRINGE | INTRAVENOUS | Status: DC | PRN
  Administered 2023-06-06: 100 mg via INTRAVENOUS

## 2023-06-06 MED ORDER — PROPOFOL 10 MG/ML IV BOLUS
INTRAVENOUS | Status: DC | PRN
  Administered 2023-06-06: 60 mg via INTRAVENOUS
  Administered 2023-06-06: 20 mg via INTRAVENOUS
  Administered 2023-06-06: 30 mg via INTRAVENOUS
  Administered 2023-06-06: 10 mg via INTRAVENOUS
  Administered 2023-06-06: 20 mg via INTRAVENOUS

## 2023-06-06 MED ORDER — GLYCOPYRROLATE 0.2 MG/ML IJ SOLN
INTRAMUSCULAR | Status: DC | PRN
  Administered 2023-06-06: .2 mg via INTRAVENOUS

## 2023-06-06 NOTE — Op Note (Signed)
[facility] Gastroenterology Patient Name: Audrey Hughes Procedure Date: 06/06/2023 9:03 AM MRN: [id number] Account #: [id number] Date of Birth: Feb 26, 1956 Admit Type: Outpatient Age: 67 Room: [facility] ENDO ROOM 1 Gender: Female Note Status: Finalized Instrument Name: Colonoscope [id number] Procedure:             Colonoscopy Indications:           Heme positive stool Providers:             [name] DO, DO Medicines:             Monitored Anesthesia Care Complications:         No immediate complications. Estimated blood loss:                         Minimal. Procedure:             Pre-Anesthesia Assessment:                        - Prior to the procedure, a History and Physical was                         performed, and patient medications and allergies were                         reviewed. The patient is competent. The risks and                         benefits of the procedure and the sedation options and                         risks were discussed with the patient. All questions                         were answered and informed consent was obtained.                         Patient identification and proposed procedure were                         verified by the physician, the nurse, the anesthetist                         and the technician in the endoscopy suite. Mental                         Status Examination: alert and oriented. Airway                         Examination: normal oropharyngeal airway and neck                         mobility. Respiratory Examination: clear to                         auscultation. CV Examination: RRR, no murmurs, no S3                         or S4. Prophylactic Antibiotics: The patient does not  require prophylactic antibiotics. Prior                         Anticoagulants: The patient has taken Eliquis                         (apixaban), last dose was 5 days prior to procedure.                          ASA Grade Assessment: III - A patient with severe                         systemic disease. After reviewing the risks and                         benefits, the patient was deemed in satisfactory                         condition to undergo the procedure. The anesthesia                         plan was to use monitored anesthesia care (MAC).                         Immediately prior to administration of medications,                         the patient was re-assessed for adequacy to receive                         sedatives. The heart rate, respiratory rate, oxygen                         saturations, blood pressure, adequacy of pulmonary                         ventilation, and response to care were monitored                         throughout the procedure. The physical status of the                         patient was re-assessed after the procedure.                        After obtaining informed consent, the colonoscope was                         passed under direct vision. Throughout the procedure,                         the patient's blood pressure, pulse, and oxygen                         saturations were monitored continuously. The                         Colonoscope was introduced through the anus and  advanced to the the cecum, identified by appendiceal                         orifice and ileocecal valve. The patient tolerated the                         procedure well. The quality of the bowel preparation                         was evaluated using the BBPS [facility] Bowel Preparation                         Scale) with scores of: Right Colon = 2 (minor amount                         of residual staining, small fragments of stool and/or                         opaque liquid, but mucosa seen well), Transverse Colon                         = 3 (entire mucosa seen well with no residual                         staining, small fragments of stool or opaque  liquid)                         and Left Colon = 2 (minor amount of residual staining,                         small fragments of stool and/or opaque liquid, but                         mucosa seen well). The total BBPS score equals 7. The                         quality of the bowel preparation was good. The                         ileocecal valve, appendiceal orifice, and rectum were                         photographed. The colonoscopy was somewhat difficult                         due to a redundant colon and the patient's body                         habitus. Successful completion of the procedure was                         aided by straightening and shortening the scope to                         obtain bowel loop reduction, using scope torsion and  lavage. Findings:      The perianal and digital rectal examinations were normal. Pertinent       negatives include normal sphincter tone.      Multiple small-mouthed diverticula were found in the sigmoid colon.      There was a medium-sized lipoma, in the descending colon. Estimated       blood loss: none.      Normal mucosa was found in the entire colon. Biopsies for histology were       taken with a cold forceps from the right colon and left colon for       evaluation of microscopic colitis. Estimated blood loss was minimal.      A 1 mm polyp was found in the rectum. The polyp was sessile. The polyp       was removed with a jumbo cold forceps. Resection and retrieval were       complete. Estimated blood loss was minimal.      Nine sessile polyps were found in the sigmoid colon (1), transverse       colon (7) and cecum (1). The polyps were 3 to 9 mm in size. These polyps       were removed with a cold snare. Resection and retrieval were complete.       Estimated blood loss was minimal. To prevent bleeding after the       polypectomy, two hemostatic clips were successfully placed (MR       conditional). There was  no bleeding at the end of the procedure. these       two clips were placed on one polypectomy site in the transverse colon.      The exam was otherwise without abnormality on direct and retroflexion       views. Impression:            - Diverticulosis in the sigmoid colon.                        - Medium-sized lipoma in the descending colon.                        - Normal mucosa in the entire examined colon. Biopsied.                        - One 1 mm polyp in the rectum, removed with a jumbo                         cold forceps. Resected and retrieved.                        - Nine 3 to 9 mm polyps in the sigmoid colon, in the                         transverse colon and in the cecum, removed with a cold                         snare. Resected and retrieved. Clips (MR conditional)                         were placed.                        -  The examination was otherwise normal on direct and                         retroflexion views. Recommendation:        - Patient has a contact number available for                         emergencies. The signs and symptoms of potential                         delayed complications were discussed with the patient.                         Return to normal activities tomorrow. Written                         discharge instructions were provided to the patient.                        - Discharge patient to home.                        - Resume previous diet.                        - No aspirin, ibuprofen, naproxen, or other                         non-steroidal anti-inflammatory drugs for 5 days after                         polyp removal.                        - Resume Eliquis (apixaban) at prior dose in 4 days.                         Refer to managing physician for further adjustment of                         therapy.                        - Await pathology results.                        - Repeat colonoscopy in 1 year for surveillance of                          multiple polyps.                        - Return to GI office as previously scheduled.                        - The findings and recommendations were discussed with                         the patient's family.                        -  The findings and recommendations were discussed with                         the patient. Procedure Code(s):     --- Professional ---                        [phone], Colonoscopy, flexible; with removal of                         tumor(s), polyp(s), or other lesion(s) by snare                         technique                        45380, 59, Colonoscopy, flexible; with biopsy, single                         or multiple Diagnosis Code(s):     --- Professional ---                        D17.5, Benign lipomatous neoplasm of intra-abdominal                         organs                        D12.8, Benign neoplasm of rectum                        D12.5, Benign neoplasm of sigmoid colon                        D12.3, Benign neoplasm of transverse colon (hepatic                         flexure or splenic flexure)                        D12.0, Benign neoplasm of cecum                        R19.5, Other fecal abnormalities                        K57.30, Diverticulosis of large intestine without                         perforation or abscess without bleeding CPT copyright 2022 American Medical Association. All rights reserved. The codes documented in this report are preliminary and upon coder review may  be revised to meet current compliance requirements. Attending Participation:      I personally performed the entire procedure. Elfredia Nevins, DO [name] DO, DO 06/06/2023 10:44:01 AM This report has been signed electronically. Number of Addenda: 0 Note Initiated On: 06/06/2023 9:03 AM Scope Withdrawal Time: 0 hours 18 minutes 56 seconds  Total Procedure Duration: 0 hours 43 minutes 57 seconds  Estimated Blood Loss:  Estimated blood  loss was minimal.      [facility]

## 2023-06-06 NOTE — Interval H&P Note (Signed)
History and Physical Interval Note: Preprocedure H&P from 06/06/23  was reviewed and there was no interval change after seeing and examining the patient.  Written consent was obtained from the patient after discussion of risks, benefits, and alternatives. Patient has consented to proceed with Esophagogastroduodenoscopy and Colonoscopy with possible intervention   06/06/2023 9:26 AM  Audrey Hughes  has presented today for surgery, with the diagnosis of ecal occult blood test positive (R19.5) Diarrhea, unspecified type (R19.7) Gastroesophageal reflux disease, unspecified whether esophagitis present (K21.9) Dysphagia, unspecified type (R13.10).  The various methods of treatment have been discussed with the patient and family. After consideration of risks, benefits and other options for treatment, the patient has consented to  Procedure(s): COLONOSCOPY (N/A) ESOPHAGOGASTRODUODENOSCOPY (EGD) (N/A) as a surgical intervention.  The patient's history has been reviewed, patient examined, no change in status, stable for surgery.  I have reviewed the patient's chart and labs.  Questions were answered to the patient's satisfaction.     [name]

## 2023-06-06 NOTE — Anesthesia Procedure Notes (Signed)
Procedure Name: General with mask airway Date/Time: 06/06/2023 9:33 AM  Performed by: Mohammed Kindle, CRNAPre-anesthesia Checklist: Patient identified, Emergency Drugs available, Suction available and Patient being monitored Patient Re-evaluated:Patient Re-evaluated prior to induction Oxygen Delivery Method: Simple face mask Induction Type: IV induction Placement Confirmation: positive ETCO2, CO2 detector and breath sounds checked- equal and bilateral Dental Injury: Teeth and Oropharynx as per pre-operative assessment

## 2023-06-06 NOTE — Anesthesia Postprocedure Evaluation (Signed)
Anesthesia Post Note  Patient: Audrey Hughes  Procedure(s) Performed: COLONOSCOPY ESOPHAGOGASTRODUODENOSCOPY (EGD)  Patient location during evaluation: PACU Anesthesia Type: General Level of consciousness: awake and alert, oriented and patient cooperative Pain management: pain level controlled Vital Signs Assessment: post-procedure vital signs reviewed and stable Respiratory status: spontaneous breathing, nonlabored ventilation and respiratory function stable Cardiovascular status: blood pressure returned to baseline and stable Postop Assessment: adequate PO intake Anesthetic complications: no   No notable events documented.   Last Vitals:  Vitals:   06/06/23 1047 06/06/23 1057  BP: 127/70 130/73  Pulse: 80 83  Resp: 19 (!) 8  Temp:    SpO2: 100% 97%    Last Pain:  Vitals:   06/06/23 1057  TempSrc:   PainSc: 0-No pain                 Reed Breech

## 2023-06-06 NOTE — Op Note (Signed)
[facility] Gastroenterology Patient Name: Audrey Hughes Procedure Date: 06/06/2023 9:03 AM MRN: [id number] Account #: [id number] Date of Birth: July 29, 1956 Admit Type: Outpatient Age: 67 Room: [facility] ENDO ROOM 1 Gender: Female Note Status: Finalized Instrument Name: Upper Endoscope [id number] Procedure:             Upper GI endoscopy Indications:           Dysphagia Providers:             [name] DO, DO Medicines:             Monitored Anesthesia Care Complications:         No immediate complications. Estimated blood loss:                         Minimal. Procedure:             Pre-Anesthesia Assessment:                        - Prior to the procedure, a History and Physical was                         performed, and patient medications and allergies were                         reviewed. The patient is competent. The risks and                         benefits of the procedure and the sedation options and                         risks were discussed with the patient. All questions                         were answered and informed consent was obtained.                         Patient identification and proposed procedure were                         verified by the physician, the nurse, the anesthetist                         and the technician in the endoscopy suite. Mental                         Status Examination: alert and oriented. Airway                         Examination: normal oropharyngeal airway and neck                         mobility. Respiratory Examination: clear to                         auscultation. CV Examination: RRR, no murmurs, no S3                         or S4. Prophylactic Antibiotics: The patient does not  require prophylactic antibiotics. Prior                         Anticoagulants: The patient has taken Eliquis                         (apixaban), last dose was 5 days prior to procedure.                          ASA Grade Assessment: III - A patient with severe                         systemic disease. After reviewing the risks and                         benefits, the patient was deemed in satisfactory                         condition to undergo the procedure. The anesthesia                         plan was to use monitored anesthesia care (MAC).                         Immediately prior to administration of medications,                         the patient was re-assessed for adequacy to receive                         sedatives. The heart rate, respiratory rate, oxygen                         saturations, blood pressure, adequacy of pulmonary                         ventilation, and response to care were monitored                         throughout the procedure. The physical status of the                         patient was re-assessed after the procedure.                        After obtaining informed consent, the endoscope was                         passed under direct vision. Throughout the procedure,                         the patient's blood pressure, pulse, and oxygen                         saturations were monitored continuously. The                         Endosonoscope was introduced through the mouth, and  advanced to the second part of duodenum. The upper GI                         endoscopy was accomplished without difficulty. The                         patient tolerated the procedure well. Findings:      The duodenal bulb, first portion of the duodenum and second portion of       the duodenum were normal. Biopsies for histology were taken with a cold       forceps for evaluation of celiac disease. Estimated blood loss was       minimal.      A single 4 to 5 mm sessile polyp with no bleeding and no stigmata of       recent bleeding was found on the greater curvature of the stomach.       Biopsies were taken with a cold forceps for histology.  Estimated blood       loss was minimal.      The exam of the stomach was otherwise normal.      The Z-line was regular. Estimated blood loss: none.      Esophagogastric landmarks were identified: the gastroesophageal junction       was found at 41 cm from the incisors.      Normal mucosa was found in the entire esophagus. The scope was       withdrawn. Dilation was performed with a Maloney dilator with no       resistance at 52 Fr. The dilation site was examined following endoscope       reinsertion and showed no change. Biopsies were obtained from the       proximal and distal esophagus with cold forceps for histology of       suspected eosinophilic esophagitis. Estimated blood loss was minimal.      Abnormal motility was noted in the esophagus. The cricopharyngeus was       normal. There is spasticity of the esophageal body. The distal       esophagus/lower esophageal sphincter is open. Estimated blood loss: none. Impression:            - Normal duodenal bulb, first portion of the duodenum                         and second portion of the duodenum. Biopsied.                        - A single gastric polyp. Biopsied.                        - Z-line regular.                        - Esophagogastric landmarks identified.                        - Normal mucosa was found in the entire esophagus.                         Dilated.                        -  Abnormal esophageal motility.                        - Biopsies were taken with a cold forceps for                         evaluation of eosinophilic esophagitis. Recommendation:        - Patient has a contact number available for                         emergencies. The signs and symptoms of potential                         delayed complications were discussed with the patient.                         Return to normal activities tomorrow. Written                         discharge instructions were provided to the patient.                         - Discharge patient to home.                        - Soft diet today.                        - Continue present medications.                        - No ibuprofen, naproxen, or other non-steroidal                         anti-inflammatory drugs.                        - See colonoscopy report for eliquis instructions                        - Await pathology results.                        - Return to GI clinic as previously scheduled.                        - proceed with colonoscopy                        - The findings and recommendations were discussed with                         the patient's family.                        - The findings and recommendations were discussed with                         the patient. Procedure Code(s):     --- Professional ---  16109, Esophagogastroduodenoscopy, flexible,                         transoral; with biopsy, single or multiple                        43450, Dilation of esophagus, by unguided sound or                         bougie, single or multiple passes Diagnosis Code(s):     --- Professional ---                        K31.7, Polyp of stomach and duodenum                        K22.4, Dyskinesia of esophagus                        R13.10, Dysphagia, unspecified CPT copyright 2022 American Medical Association. All rights reserved. The codes documented in this report are preliminary and upon coder review may  be revised to meet current compliance requirements. Attending Participation:      I personally performed the entire procedure. Elfredia Nevins, DO [name] DO, DO 06/06/2023 9:48:21 AM This report has been signed electronically. Number of Addenda: 0 Note Initiated On: 06/06/2023 9:03 AM Estimated Blood Loss:  Estimated blood loss was minimal.      [facility]

## 2023-06-06 NOTE — Anesthesia Preprocedure Evaluation (Addendum)
Anesthesia Evaluation  Patient identified by MRN, date of birth, ID band Patient awake    Reviewed: Allergy & Precautions, NPO status , Patient's Chart, lab work & pertinent test results  History of Anesthesia Complications Negative for: history of anesthetic complications  Airway Mallampati: III   Neck ROM: Full    Dental  (+) Edentulous Upper, Edentulous Lower   Pulmonary asthma , sleep apnea , COPD (2L home O2 at night)   Pulmonary exam normal breath sounds clear to auscultation       Cardiovascular hypertension, + CAD (s/p MI)  Normal cardiovascular exam+ dysrhythmias (a fib on Eliwuis)  Rhythm:Regular Rate:Normal     Neuro/Psych  PSYCHIATRIC DISORDERS  Depression     Neuromuscular disease (Parkinson disease;  neuropathy)    GI/Hepatic ,GERD  ,,  Endo/Other  diabetes, Type 2  Class 3 obesity  Renal/GU Renal disease (nephrolithiasis, stage III CKD)     Musculoskeletal   Abdominal   Peds  Hematology  (+) Blood dyscrasia, anemia   Anesthesia Other Findings Last dose of Trulicity 05/23/23.   Cardiology note 03/20/22:  1. 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 Blood pressures controlled 132/60 -No changes to current management  2. HTN: Currently well controlled at 132/60. -continue quinapril, metoprolol  3. CAD: With no complaints of angina or anginal equivalent at this time -Continue Eliquis, rosuvastatin for secondary ASCVD risk reduction -Continue quinapril, metoprolol for further CV risk reduction  4. Paroxysmal atrial flutter: With no worsening complaints of palpitations or irregular heartbeats over the last few months -Continue Eliquis 5 mg twice daily for stroke risk reduction. This is the appropriate dose as the patient is under 88 years old and weighs greater than  60 kg -Continue metoprolol with no changes  No orders of the defined types were placed in this encounter.  Return in about 9 months (around 12/20/2022).    Reproductive/Obstetrics                             Anesthesia Physical Anesthesia Plan  ASA: 3  Anesthesia Plan: General   Post-op Pain Management:    Induction: Intravenous  PONV Risk Score and Plan: 3 and Propofol infusion, TIVA and Treatment may vary due to age or medical condition  Airway Management Planned: Natural Airway  Additional Equipment:   Intra-op Plan:   Post-operative Plan:   Informed Consent: I have reviewed the patients History and Physical, chart, labs and discussed the procedure including the risks, benefits and alternatives for the proposed anesthesia with the patient or authorized representative who has indicated his/her understanding and acceptance.       Plan Discussed with: CRNA  Anesthesia Plan Comments: (LMA/GETA backup discussed.  Patient consented for risks of anesthesia including but not limited to:  - adverse reactions to medications - damage to eyes, teeth, lips or other oral mucosa - nerve damage due to positioning  - sore throat or hoarseness - damage to heart, brain, nerves, lungs, other parts of body or loss of life  Informed patient about role of CRNA in peri- and intra-operative care.  Patient voiced understanding.)       Anesthesia Quick Evaluation

## 2023-06-06 NOTE — Transfer of Care (Signed)
Immediate Anesthesia Transfer of Care Note  Patient: Audrey Hughes  Procedure(s) Performed: COLONOSCOPY ESOPHAGOGASTRODUODENOSCOPY (EGD)  Patient Location: Endoscopy Unit  Anesthesia Type:General  Level of Consciousness: drowsy and patient cooperative  Airway & Oxygen Therapy: Patient Spontanous Breathing and Patient connected to face mask oxygen  Post-op Assessment: Report given to RN and Post -op Vital signs reviewed and stable  Post vital signs: Reviewed and stable  Last Vitals:  Vitals Value Taken Time  BP 127/70 06/06/23 1047  Temp    Pulse 80 06/06/23 1048  Resp 19 06/06/23 1048  SpO2 100 % 06/06/23 1048  Vitals shown include unvalidated device data.  Last Pain:  Vitals:   06/06/23 1047  TempSrc:   PainSc: Asleep         Complications: No notable events documented.

## 2023-06-09 ENCOUNTER — Encounter: Payer: Self-pay | Admitting: Gastroenterology

## 2023-10-15 ENCOUNTER — Emergency Department: Payer: 59

## 2023-10-15 ENCOUNTER — Emergency Department
Admission: EM | Admit: 2023-10-15 | Discharge: 2023-10-15 | Disposition: A | Discharge status: Home / Self Care | Payer: 59 | Attending: Emergency Medicine | Admitting: Emergency Medicine

## 2023-10-15 ENCOUNTER — Other Ambulatory Visit: Payer: Self-pay

## 2023-10-15 DIAGNOSIS — M25561 Pain in right knee: Secondary | ICD-10-CM

## 2023-10-15 DIAGNOSIS — I10 Essential (primary) hypertension: Secondary | ICD-10-CM | POA: Insufficient documentation

## 2023-10-15 DIAGNOSIS — E119 Type 2 diabetes mellitus without complications: Secondary | ICD-10-CM | POA: Diagnosis not present

## 2023-10-15 DIAGNOSIS — J449 Chronic obstructive pulmonary disease, unspecified: Secondary | ICD-10-CM | POA: Diagnosis not present

## 2023-10-15 DIAGNOSIS — S50312A Abrasion of left elbow, initial encounter: Secondary | ICD-10-CM | POA: Diagnosis present

## 2023-10-15 DIAGNOSIS — S0990XA Unspecified injury of head, initial encounter: Secondary | ICD-10-CM | POA: Diagnosis not present

## 2023-10-15 DIAGNOSIS — Z7901 Long term (current) use of anticoagulants: Secondary | ICD-10-CM | POA: Diagnosis not present

## 2023-10-15 DIAGNOSIS — T148XXA Other injury of unspecified body region, initial encounter: Secondary | ICD-10-CM

## 2023-10-15 DIAGNOSIS — W06XXXA Fall from bed, initial encounter: Secondary | ICD-10-CM | POA: Insufficient documentation

## 2023-10-15 DIAGNOSIS — I4892 Unspecified atrial flutter: Secondary | ICD-10-CM | POA: Diagnosis not present

## 2023-10-15 DIAGNOSIS — W19XXXA Unspecified fall, initial encounter: Secondary | ICD-10-CM

## 2023-10-15 MED ORDER — MORPHINE SULFATE (PF) 4 MG/ML IV SOLN
4.0000 mg | Freq: Once | INTRAVENOUS | Status: AC
  Administered 2023-10-15: 4 mg via INTRAMUSCULAR
  Filled 2023-10-15: qty 1

## 2023-10-15 NOTE — Discharge Instructions (Addendum)
Please use your walker for the next week or so as needed.  Please follow-up with your doctor in the next several days for recheck/reevaluation.

## 2023-10-15 NOTE — ED Triage Notes (Signed)
Pt arrived via EMS from home d/t falling out of her bed in the middle of the night. Pt didn't not wake up during this fall and did not realize she was on the floor until she woke up this morning. Pt has laceration to her left elbow and bruising to her right knee. Pt is ambulatory at baseline and lives at home with her grandson.

## 2023-10-15 NOTE — ED Provider Notes (Signed)
[facility] Provider Note    Event Date/Time   First MD Initiated Contact with Patient 10/15/23 1034     (approximate)  History   Chief Complaint: Fall  HPI  Audrey Hughes is a 67 y.o. female with a past medical history of COPD, diabetes, gastric reflux, hypertension, atrial flutter on Eliquis, presents to the emergency department after a fall.  According to the patient she states she rolled out of bed this morning falling onto the ground.  Does not believe she hit her head but is not sure.  Denies any headache.  Patient states some pain to her left elbow where she has an abrasion/skin tear as well as significant pain to the right knee.  Physical Exam   Triage Vital Signs: ED Triage Vitals  Encounter Vitals Group     BP 10/15/23 1040 134/62     Systolic BP Percentile --      Diastolic BP Percentile --      Pulse Rate 10/15/23 1040 73     Resp 10/15/23 1040 16     Temp 10/15/23 1040 98 F (36.7 C)     Temp Source 10/15/23 1040 Oral     SpO2 10/15/23 1040 100 %     Weight 10/15/23 1041 (!) 360 lb (163.3 kg)     Height 10/15/23 1041 6' (1.829 m)     Head Circumference --      Peak Flow --      Pain Score 10/15/23 1041 9     Pain Loc --      Pain Education --      Exclude from Growth Chart --     Most recent vital signs: Vitals:   10/15/23 1040  BP: 134/62  Pulse: 73  Resp: 16  Temp: 98 F (36.7 C)  SpO2: 100%    General: Awake, no distress.  CV:  Good peripheral perfusion.  Regular rate and rhythm  Resp:  Normal effort.  Equal breath sounds bilaterally.  Abd:  No distention.  Soft, nontender.  No rebound or guarding. Other:  Patient has a small abrasion to left elbow but great range of motion neurovascularly intact distally.  Patient has significant swelling and tenderness to the right knee with pain with any attempted range of motion of the right knee.   ED Results / Procedures / Treatments   RADIOLOGY  I have reviewed and  interpreted the x-ray images.  No obvious fracture seen on my evaluation. Radiology is read the x-ray is negative for acute fracture.  Positive soft tissue swelling. CT scan head is negative   MEDICATIONS ORDERED IN ED: Medications  morphine (PF) 4 MG/ML injection 4 mg (has no administration in time range)     IMPRESSION / MDM / ASSESSMENT AND PLAN / ED COURSE  I reviewed the triage vital signs and the nursing notes.  Patient's presentation is most consistent with acute presentation with potential threat to life or bodily function.  Patient presents to the emergency department after falling out of bed.  Patient has a small abrasion to left elbow no concern for fracture.  Patient does have significant tenderness and pain to the right knee concerning for hematoma possibly patella fracture we will obtain x-ray images to further evaluate.  Patient does not believe she hit her head but given the fall and the patient is on Eliquis we will obtain CT imaging of the head as a precaution to rule out intracranial hemorrhage which could be due  to head injury or shear forces.  Patient agreeable to plan of care and workup.  Imaging is negative for acute abnormality besides soft tissue swelling around the knee.  We will ambulate the patient to ensure she is able to safely ambulate prior to discharge home with a short course of pain medication.  Patient ambulated great with a 4 point walker.  Will discharge with the same.  We will discharge with a short course of pain medication as well.  FINAL CLINICAL IMPRESSION(S) / ED DIAGNOSES   Fall Right knee pain Abrasion  ument was prepared using Dragon voice recognition software and may include unintentional dictation errors.   Minna Antis, MD 10/15/23 1358
